# Patient Record
Sex: Male | Born: 1995 | Race: White | Hispanic: No | Marital: Single | Smoking: Light tobacco smoker
Health system: Southern US, Community
[De-identification: ages and names within clinical notes are randomized; demographics above are authoritative.]

## PROBLEM LIST (undated history)

## (undated) DIAGNOSIS — K449 Diaphragmatic hernia without obstruction or gangrene: Secondary | ICD-10-CM

## (undated) NOTE — *Deleted (*Deleted)
  [facility]-EMERGENCY DEPT Provider Note   CSN: [id number] Arrival date & time: 07/04/20  2208     History No chief complaint on file.   Garrett Baxter is a 50 y.o. male.  HPI     History reviewed. No pertinent past medical history.  There are no problems to display for this patient.   History reviewed. No pertinent surgical history.     History reviewed. No pertinent family history.  Social History   Tobacco Use  . Smoking status: Never Smoker  . Smokeless tobacco: Never Used  Substance Use Topics  . Alcohol use: Never  . Drug use: Never    Home Medications Prior to Admission medications   Not on File    Allergies    Patient has no allergy information on record.  Review of Systems   Review of Systems  Physical Exam Updated Vital Signs BP (!) 158/105 (BP Location: Left Arm)   Pulse 98   Temp 98.4 F (36.9 C) (Oral)   Resp 19   Ht 5\' 7"  (1.702 m)   Wt 111.1 kg   SpO2 99%   BMI 38.37 kg/m   Physical Exam  ED Results / Procedures / Treatments   Labs (all labs ordered are listed, but only abnormal results are displayed) Labs Reviewed - No data to display  EKG None  Radiology No results found.  Procedures Procedures (including critical care time)  Medications Ordered in ED Medications - No data to display  ED Course  I have reviewed the triage vital signs and the nursing notes.  Pertinent labs & imaging results that were available during my care of the patient were reviewed by me and considered in my medical decision making (see chart for details).    MDM Rules/Calculators/A&P                          *** Final Clinical Impression(s) / ED Diagnoses Final diagnoses:  None    Rx / DC Orders ED Discharge Orders    None

---

## 2020-07-04 ENCOUNTER — Encounter (HOSPITAL_COMMUNITY): Payer: Self-pay

## 2020-07-04 ENCOUNTER — Emergency Department (HOSPITAL_COMMUNITY)
Admission: EM | Admit: 2020-07-04 | Discharge: 2020-07-05 | Disposition: A | Discharge status: Home / Self Care | Payer: BC Managed Care – PPO | Attending: Emergency Medicine | Admitting: Emergency Medicine

## 2020-07-04 ENCOUNTER — Other Ambulatory Visit: Payer: Self-pay

## 2020-07-04 DIAGNOSIS — T7802XA Anaphylactic reaction due to shellfish (crustaceans), initial encounter: Secondary | ICD-10-CM | POA: Insufficient documentation

## 2020-07-04 DIAGNOSIS — T7840XA Allergy, unspecified, initial encounter: Secondary | ICD-10-CM

## 2020-07-04 DIAGNOSIS — L500 Allergic urticaria: Secondary | ICD-10-CM | POA: Diagnosis not present

## 2020-07-04 MED ORDER — METHYLPREDNISOLONE SODIUM SUCC 125 MG IJ SOLR
125.0000 mg | Freq: Once | INTRAMUSCULAR | Status: AC
  Administered 2020-07-04: 125 mg via INTRAVENOUS
  Filled 2020-07-04: qty 2

## 2020-07-04 MED ORDER — FAMOTIDINE IN NACL 20-0.9 MG/50ML-% IV SOLN
20.0000 mg | Freq: Once | INTRAVENOUS | Status: AC
  Administered 2020-07-04: 20 mg via INTRAVENOUS
  Filled 2020-07-04: qty 50

## 2020-07-04 NOTE — ED Provider Notes (Signed)
[facility]-EMERGENCY DEPT Provider Note   CSN: [id number] Arrival date & time: 07/04/20  2208     History No chief complaint on file.   Garrett Baxter is a 24 y.o. male who presents to ED for possible allergic reaction.  States that he was eating shrimp when he started developing hives throughout his body.  States that he has eaten shrimp in the past without any issues.  He reports itching throughout his entire torso.  Denies any lip or tongue swelling, trouble breathing or trouble swallowing.  He took 50 mg of Benadryl with some improvement in his itching.  Denies any new soaps, lotions, detergents or other food exposures.  He has not had this type of reaction in the past to anything.  HPI     History reviewed. No pertinent past medical history.  There are no problems to display for this patient.   History reviewed. No pertinent surgical history.     History reviewed. No pertinent family history.  Social History   Tobacco Use  . Smoking status: Never Smoker  . Smokeless tobacco: Never Used  Substance Use Topics  . Alcohol use: Never  . Drug use: Never    Home Medications Prior to Admission medications   Medication Sig Start Date End Date Taking? Authorizing Provider  predniSONE (STERAPRED UNI-PAK 21 TAB) 10 MG (21) TBPK tablet Take by mouth daily. Take 6 tabs by mouth daily  for 2 days, then 5 tabs for 2 days, then 4 tabs for 2 days, then 3 tabs for 2 days, 2 tabs for 2 days, then 1 tab by mouth daily for 2 days 07/05/20   Dietrich Pates, PA-C    Allergies    Patient has no allergy information on record.  Review of Systems   Review of Systems  HENT: Negative for facial swelling and trouble swallowing.   Respiratory: Negative for shortness of breath.   Skin: Positive for rash.  Neurological: Negative for weakness.    Physical Exam Updated Vital Signs BP 117/73   Pulse 94   Temp 98.4 F (36.9 C) (Oral)   Resp 16   Ht 5\' 7"  (1.702 m)    Wt 111.1 kg   SpO2 99%   BMI 38.37 kg/m   Physical Exam Vitals and nursing note reviewed.  Constitutional:      General: He is not in acute distress.    Appearance: He is well-developed. He is not diaphoretic.     Comments: No stridor.  No lip or tongue swelling.  No signs of angioedema or anaphylaxis.  Speaking complete sentences without difficulty.  HENT:     Head: Normocephalic and atraumatic.  Eyes:     General: No scleral icterus.    Conjunctiva/sclera: Conjunctivae normal.  Cardiovascular:     Rate and Rhythm: Normal rate and regular rhythm.  Pulmonary:     Effort: Pulmonary effort is normal. No respiratory distress.     Breath sounds: Normal breath sounds. No stridor.  Musculoskeletal:     Cervical back: Normal range of motion.  Skin:    Findings: Rash present.     Comments: Some scattered urticaria noted on torso.  Neurological:     Mental Status: He is alert.     ED Results / Procedures / Treatments   Labs (all labs ordered are listed, but only abnormal results are displayed) Labs Reviewed - No data to display  EKG None  Radiology No results found.  Procedures Procedures (including critical care time)  Medications Ordered in ED Medications  methylPREDNISolone sodium succinate (SOLU-MEDROL) 125 mg/2 mL injection 125 mg (125 mg Intravenous Given 07/04/20 2254)  famotidine (PEPCID) IVPB 20 mg premix (0 mg Intravenous Stopped 07/04/20 2325)    ED Course  I have reviewed the triage vital signs and the nursing notes.  Pertinent labs & imaging results that were available during my care of the patient were reviewed by me and considered in my medical decision making (see chart for details).    MDM Rules/Calculators/A&P                          24 year old male who presents to ED for possible allergic reaction. He ate shrimp just prior to arrival, which he has eaten several times in the past without difficulty.  He started developing hives throughout his  torso.  Denies any lip or tongue swelling, trouble breathing or stridor.  He took 50 mg of Benadryl with some improvement in his rash.  He has some hives scattered in torso. No signs of angioedema or anaphylaxis.  Vital signs are within normal limits.  He was given Solu-Medrol and Pepcid with resolution of his symptoms.  He remains hemodynamically stable.  Unsure what his specific trigger was although I told him to avoid shellfish until he is formally allergy tested.  Will discharge with remainder of steroid course and antihistamines.  Return precautions given.  Patient is hemodynamically stable, in NAD, and able to ambulate in the ED. Evaluation does not show pathology that would require ongoing emergent intervention or inpatient treatment. I explained the diagnosis to the patient. Pain has been managed and has no complaints prior to discharge. Patient is comfortable with above plan and is stable for discharge at this time. All questions were answered prior to disposition. Strict return precautions for returning to the ED were discussed. Encouraged follow up with PCP.   An After Visit Summary was printed and given to the patient.   Portions of this note were generated with Scientist, clinical (histocompatibility and immunogenetics). Dictation errors may occur despite best attempts at proofreading.  Final Clinical Impression(s) / ED Diagnoses Final diagnoses:  Allergic reaction, initial encounter    Rx / DC Orders ED Discharge Orders         Ordered    predniSONE (STERAPRED UNI-PAK 21 TAB) 10 MG (21) TBPK tablet  Daily        07/05/20 0003           Dietrich Pates, PA-C 07/05/20 0018    Derwood Kaplan, MD 07/05/20 [phone]

## 2020-07-04 NOTE — ED Triage Notes (Signed)
Pt states that he may be having an allergic reaction to shellfish, no known allergy before He states he ate shrimp fajitas and started to turn red and develop hives

## 2020-07-05 MED ORDER — PREDNISONE 10 MG (21) PO TBPK: ORAL_TABLET | Freq: Every day | ORAL | 0 refills | Status: DC

## 2020-07-05 NOTE — Discharge Instructions (Addendum)
Take the steroids beginning tomorrow. You can also take antihistamine such as Benadryl or Pepcid to help with your symptoms. Return to the ER if you start to experience worsening hives, rash, lip or tongue swelling, trouble breathing or trouble swallowing. You may eventually need formal allergy testing to see what the trigger was that caused this reaction today. In the meantime, you should avoid shellfish as this may have caused a reaction today.

## 2020-07-24 ENCOUNTER — Other Ambulatory Visit: Payer: Self-pay

## 2020-07-24 ENCOUNTER — Emergency Department (HOSPITAL_COMMUNITY): Payer: BC Managed Care – PPO

## 2020-07-24 ENCOUNTER — Encounter (HOSPITAL_COMMUNITY): Payer: Self-pay | Admitting: Student

## 2020-07-24 ENCOUNTER — Emergency Department (HOSPITAL_COMMUNITY)
Admission: EM | Admit: 2020-07-24 | Discharge: 2020-07-24 | Disposition: A | Discharge status: Home / Self Care | Payer: BC Managed Care – PPO | Attending: Emergency Medicine | Admitting: Emergency Medicine

## 2020-07-24 DIAGNOSIS — R1013 Epigastric pain: Secondary | ICD-10-CM | POA: Diagnosis present

## 2020-07-24 DIAGNOSIS — F17210 Nicotine dependence, cigarettes, uncomplicated: Secondary | ICD-10-CM | POA: Diagnosis not present

## 2020-07-24 DIAGNOSIS — R1110 Vomiting, unspecified: Secondary | ICD-10-CM | POA: Insufficient documentation

## 2020-07-24 DIAGNOSIS — R109 Unspecified abdominal pain: Secondary | ICD-10-CM

## 2020-07-24 DIAGNOSIS — K8020 Calculus of gallbladder without cholecystitis without obstruction: Secondary | ICD-10-CM | POA: Diagnosis not present

## 2020-07-24 HISTORY — DX: Diaphragmatic hernia without obstruction or gangrene: K44.9

## 2020-07-24 LAB — COMPREHENSIVE METABOLIC PANEL
ALT: 54 U/L — ABNORMAL HIGH (ref 0–44)
AST: 27 U/L (ref 15–41)
Albumin: 4.7 g/dL (ref 3.5–5.0)
Alkaline Phosphatase: 57 U/L (ref 38–126)
Anion gap: 11 (ref 5–15)
BUN: 13 mg/dL (ref 6–20)
CO2: 31 mmol/L (ref 22–32)
Calcium: 9.6 mg/dL (ref 8.9–10.3)
Chloride: 100 mmol/L (ref 98–111)
Creatinine, Ser: 0.79 mg/dL (ref 0.61–1.24)
GFR, Estimated: >60 mL/min (ref >60)
Glucose, Bld: 130 mg/dL — ABNORMAL HIGH (ref 70–99)
Potassium: 4.1 mmol/L (ref 3.5–5.1)
Sodium: 142 mmol/L (ref 135–145)
Total Bilirubin: 0.5 mg/dL (ref 0.3–1.2)
Total Protein: 7.7 g/dL (ref 6.5–8.1)

## 2020-07-24 LAB — CBC WITH DIFFERENTIAL/PLATELET
Abs Immature Granulocytes: 0.04 10*3/uL (ref 0.00–0.07)
Basophils Absolute: 0.1 10*3/uL (ref 0.0–0.1)
Basophils Relative: 0 %
Eosinophils Absolute: 0 10*3/uL (ref 0.0–0.5)
Eosinophils Relative: 0 %
HCT: 43 % (ref 39.0–52.0)
Hemoglobin: 14.6 g/dL (ref 13.0–17.0)
Immature Granulocytes: 0 %
Lymphocytes Relative: 21 %
Lymphs Abs: 2.5 10*3/uL (ref 0.7–4.0)
MCH: 31.3 pg (ref 26.0–34.0)
MCHC: 34 g/dL (ref 30.0–36.0)
MCV: 92.1 fL (ref 80.0–100.0)
Monocytes Absolute: 0.7 10*3/uL (ref 0.1–1.0)
Monocytes Relative: 6 %
Neutro Abs: 8.9 10*3/uL — ABNORMAL HIGH (ref 1.7–7.7)
Neutrophils Relative %: 73 %
Platelets: 355 10*3/uL (ref 150–400)
RBC: 4.67 MIL/uL (ref 4.22–5.81)
RDW: 12.3 % (ref 11.5–15.5)
WBC: 12.2 10*3/uL — ABNORMAL HIGH (ref 4.0–10.5)
nRBC: 0 % (ref 0.0–0.2)

## 2020-07-24 LAB — URINALYSIS, ROUTINE W REFLEX MICROSCOPIC
Bilirubin Urine: NEGATIVE
Glucose, UA: NEGATIVE mg/dL
Hgb urine dipstick: NEGATIVE
Ketones, ur: 5 mg/dL — AB
Leukocytes,Ua: NEGATIVE
Nitrite: NEGATIVE
Protein, ur: NEGATIVE mg/dL
Specific Gravity, Urine: 1.028 (ref 1.005–1.030)
pH: 5 (ref 5.0–8.0)

## 2020-07-24 LAB — LIPASE, BLOOD: Lipase: 26 U/L (ref 11–51)

## 2020-07-24 MED ORDER — HYDROCODONE-ACETAMINOPHEN 5-325 MG PO TABS
2.0000 | ORAL_TABLET | ORAL | 0 refills | Status: DC | PRN
Start: 2020-07-24 — End: 2020-07-27

## 2020-07-24 MED ORDER — FAMOTIDINE IN NACL 20-0.9 MG/50ML-% IV SOLN
20.0000 mg | Freq: Once | INTRAVENOUS | Status: AC
  Administered 2020-07-24: 20 mg via INTRAVENOUS
  Filled 2020-07-24: qty 50

## 2020-07-24 MED ORDER — ONDANSETRON HCL 4 MG/2ML IJ SOLN
4.0000 mg | Freq: Once | INTRAMUSCULAR | Status: AC
  Administered 2020-07-24: 4 mg via INTRAVENOUS
  Filled 2020-07-24: qty 2

## 2020-07-24 MED ORDER — MORPHINE SULFATE (PF) 4 MG/ML IV SOLN
4.0000 mg | Freq: Once | INTRAVENOUS | Status: AC
  Administered 2020-07-24: 4 mg via INTRAVENOUS
  Filled 2020-07-24: qty 1

## 2020-07-24 MED ORDER — ONDANSETRON 4 MG PO TBDP: 4.0000 mg | ORAL_TABLET | Freq: Three times a day (TID) | ORAL | 0 refills | Status: DC | PRN

## 2020-07-24 MED ORDER — SODIUM CHLORIDE 0.9 % IV BOLUS
1000.0000 mL | Freq: Once | INTRAVENOUS | Status: AC
  Administered 2020-07-24: 1000 mL via INTRAVENOUS

## 2020-07-24 NOTE — ED Notes (Addendum)
Cup of ice water and crackers given for PO challenge per [place], [place].

## 2020-07-24 NOTE — Discharge Instructions (Addendum)
Eat smaller meals, limit fatty/greasy meals as this can worsen the pain related to gallstones.  You can take tylenol as needed for mild-moderate pain. You can take hydrocodone as needed for more severe pain. You can take the zofran every 8 hours as needed for nausea. Establish care with the general surgeon to discuss further management of your gallstones. Return to the ED if you experience uncontrollable pain, uncontrollable vomiting, or  fever with abdominal pain.

## 2020-07-24 NOTE — ED Provider Notes (Signed)
Care assumed at shift change from [facility], PA-C, pending labs, RUQ U/S and re-evaluation. See their note for full HPI and workup. Briefly, pt presenting with epigastric/RUQ pain that began last night. Presentation consistent with bilary colic. Labs and RUQ ordered, pain and antiemetic medication given.  Plan to follow imaging and laboratory work.  Reevaluate.  If cholelithiasis without cholecystitis is present, plan for pain management and p.o. challenge.  Physical Exam  BP (!) 138/96   Pulse 87   Temp 98 F (36.7 C) (Oral)   Resp 18   Ht 5\' 7"  (1.702 m)   Wt 108.9 kg   SpO2 97%   BMI 37.59 kg/m   Physical Exam Vitals and nursing note reviewed.  Constitutional:      General: He is not in acute distress.    Appearance: He is well-developed.  HENT:     Head: Normocephalic and atraumatic.  Eyes:     Conjunctiva/sclera: Conjunctivae normal.  Cardiovascular:     Rate and Rhythm: Normal rate.  Pulmonary:     Effort: Pulmonary effort is normal.  Abdominal:     Palpations: Abdomen is soft.  Skin:    General: Skin is warm.  Neurological:     Mental Status: He is alert.  Psychiatric:        Behavior: Behavior normal.     Results for orders placed or performed during the hospital encounter of 07/24/20  Comprehensive metabolic panel  Result Value Ref Range   Sodium 142 135 - 145 mmol/L   Potassium 4.1 3.5 - 5.1 mmol/L   Chloride 100 98 - 111 mmol/L   CO2 31 22 - 32 mmol/L   Glucose, Bld 130 (H) 70 - 99 mg/dL   BUN 13 6 - 20 mg/dL   Creatinine, Ser 07/26/20 0.61 - 1.24 mg/dL   Calcium 9.6 8.9 - 5.39 mg/dL   Total Protein 7.7 6.5 - 8.1 g/dL   Albumin 4.7 3.5 - 5.0 g/dL   AST 27 15 - 41 U/L   ALT 54 (H) 0 - 44 U/L   Alkaline Phosphatase 57 38 - 126 U/L   Total Bilirubin 0.5 0.3 - 1.2 mg/dL   GFR, Estimated 67.2 >89 mL/min   Anion gap 11 5 - 15  CBC with Differential  Result Value Ref Range   WBC 12.2 (H) 4.0 - 10.5 K/uL   RBC 4.67 4.22 - 5.81 MIL/uL   Hemoglobin 14.6 13.0  - 17.0 g/dL   HCT >79 39 - 52 %   MCV 92.1 80.0 - 100.0 fL   MCH 31.3 26.0 - 34.0 pg   MCHC 34.0 30.0 - 36.0 g/dL   RDW 15.0 41.3 - 64.3 %   Platelets 355 150 - 400 K/uL   nRBC 0.0 0.0 - 0.2 %   Neutrophils Relative % 73 %   Neutro Abs 8.9 (H) 1.7 - 7.7 K/uL   Lymphocytes Relative 21 %   Lymphs Abs 2.5 0.7 - 4.0 K/uL   Monocytes Relative 6 %   Monocytes Absolute 0.7 0.1 - 1.0 K/uL   Eosinophils Relative 0 %   Eosinophils Absolute 0.0 0.0 - 0.5 K/uL   Basophils Relative 0 %   Basophils Absolute 0.1 0.0 - 0.1 K/uL   Immature Granulocytes 0 %   Abs Immature Granulocytes 0.04 0.00 - 0.07 K/uL  Lipase, blood  Result Value Ref Range   Lipase 26 11 - 51 U/L  Urinalysis, Routine w reflex microscopic  Result Value Ref Range   Color,  Urine YELLOW YELLOW   APPearance CLEAR CLEAR   Specific Gravity, Urine 1.028 1.005 - 1.030   pH 5.0 5.0 - 8.0   Glucose, UA NEGATIVE NEGATIVE mg/dL   Hgb urine dipstick NEGATIVE NEGATIVE   Bilirubin Urine NEGATIVE NEGATIVE   Ketones, ur 5 (A) NEGATIVE mg/dL   Protein, ur NEGATIVE NEGATIVE mg/dL   Nitrite NEGATIVE NEGATIVE   Leukocytes,Ua NEGATIVE NEGATIVE   US Abdomen Limited RUQ (LIVER/GB)  Result Date: 07/24/2020 CLINICAL DATA:  Right upper quadrant abdominal pain EXAM: ULTRASOUND ABDOMEN LIMITED RIGHT UPPER QUADRANT COMPARISON:  None. FINDINGS: Gallbladder: Multiple mobile gallstones are seen within the gallbladder lumen. The gallbladder, however, is not distended, there is no gallbladder wall thickening, and no pericholecystic fluid is identified. The sonographic Eulah Pont sign is reportedly negative. Common bile duct: Diameter: 4 mm in proximal diameter Liver: Hepatic parenchymal echogenicity is mildly, heterogeneously increased in keeping with changes of mild to moderate hepatic steatosis. No focal intrahepatic mass is identified. There is no intrahepatic biliary ductal dilation. Portal vein is patent on color Doppler imaging with normal direction of  blood flow towards the liver. Other: None. IMPRESSION: Cholelithiasis without sonographic evidence of acute cholecystitis. Mild to moderate hepatic steatosis. Electronically Signed   By: Helyn Numbers MD   On: 07/24/2020 06:52     ED Course/Procedures     Procedures  MDM  Ultrasound is consistent with uncomplicated cholelithiasis.  Labs with very minimal elevation in ALT to 54.  Slight leukocytosis of 12.2.  However, unlikely cholecystitis given significant improvement in symptoms, no findings suggestive of cholecystitis on ultrasound.  Patient is feeling much better on evaluation and tolerating p.o.  Discussed diagnosis and diet modifications as well as additional symptom management.  He is provided with referral to neurosurgery.  He states he is also followed by gastroenterology in [place].  Discussed strict return precautions including uncontrollable pain or vomiting, fever.  Patient verbalized understanding and agrees with care plan for discharge.  [facility] Controlled Substance reporting System queried       Eulonda Andalon, [place], PA-C 07/24/20 8299    Paula Libra, MD 07/24/20 2233

## 2020-07-24 NOTE — ED Triage Notes (Signed)
Pt came in for sharp epigastric pain that started approx three hours ago. Pt has had two episodes of vomiting and constant nausea. Pt has hx of hiatal hernia.

## 2020-07-24 NOTE — ED Provider Notes (Signed)
[facility]   CSN: [id number] Arrival date & time: 07/24/20  0522     History Chief Complaint  Patient presents with  . Abdominal Pain  . Vomiting    Garrett Baxter is a 24 y.o. male with a hx of hiatal hernia who presents to the ED with complaints of abdominal pain that woke him from sleep approximately 3 hours PTA. Patient's pain is in the epigastrium/RUQ with some radiation to the back at times, constant, sharp in nature, is currently a 7/10 in severity with no significant alleviating/aggravating factors. Associated nausea w/ 2 episodes of emesis as well as chills. Hx of similar pain in the past, never this severe/persistent. Denies fever, hematemesis, diarrhea, melena, hematochezia, dysuria, urgency, frequency, or hematuria. Ate [employer] last night for dinner.   HPI     Past Medical History:  Diagnosis Date  . Hiatal hernia     There are no problems to display for this patient.   History reviewed. No pertinent surgical history.     History reviewed. No pertinent family history.  Social History   Tobacco Use  . Smoking status: Light Tobacco Smoker    Types: Cigarettes  . Smokeless tobacco: Never Used  Substance Use Topics  . Alcohol use: Yes  . Drug use: Never    Home Medications Prior to Admission medications   Medication Sig Start Date End Date Taking? Authorizing Provider  predniSONE (STERAPRED UNI-PAK 21 TAB) 10 MG (21) TBPK tablet Take by mouth daily. Take 6 tabs by mouth daily  for 2 days, then 5 tabs for 2 days, then 4 tabs for 2 days, then 3 tabs for 2 days, 2 tabs for 2 days, then 1 tab by mouth daily for 2 days 07/05/20   Dietrich Pates, PA-C    Allergies    Shellfish allergy  Review of Systems   Review of Systems  Constitutional: Negative for chills and fever.  Respiratory: Negative for shortness of breath.   Cardiovascular: Negative for chest pain.  Gastrointestinal: Positive for abdominal  pain, nausea and vomiting. Negative for anal bleeding, blood in stool, constipation and diarrhea.  Genitourinary: Negative for dysuria, hematuria and urgency.  Neurological: Negative for syncope.  All other systems reviewed and are negative.   Physical Exam Updated Vital Signs BP (!) 147/100   Pulse 70   Temp 98 F (36.7 C) (Oral)   Resp 13   Ht 5\' 7"  (1.702 m)   Wt 108.9 kg   SpO2 97%   BMI 37.59 kg/m   Physical Exam Vitals and nursing note reviewed.  Constitutional:      General: He is not in acute distress.    Appearance: He is well-developed. He is not toxic-appearing.  HENT:     Head: Normocephalic and atraumatic.  Eyes:     General:        Right eye: No discharge.        Left eye: No discharge.     Conjunctiva/sclera: Conjunctivae normal.  Cardiovascular:     Rate and Rhythm: Normal rate and regular rhythm.  Pulmonary:     Effort: Pulmonary effort is normal. No respiratory distress.     Breath sounds: Normal breath sounds. No wheezing, rhonchi or rales.  Abdominal:     General: There is no distension.     Palpations: Abdomen is soft.     Tenderness: There is abdominal tenderness in the right upper quadrant and epigastric area. There is no guarding or rebound. Negative  signs include McBurney's sign.  Musculoskeletal:     Cervical back: Neck supple.  Skin:    General: Skin is warm and dry.     Findings: No rash.  Neurological:     Mental Status: He is alert.     Comments: Clear speech.   Psychiatric:        Behavior: Behavior normal.     ED Results / Procedures / Treatments   Labs (all labs ordered are listed, but only abnormal results are displayed) Labs Reviewed  COMPREHENSIVE METABOLIC PANEL  CBC WITH DIFFERENTIAL/PLATELET  LIPASE, BLOOD    EKG None  Radiology No results found.  Procedures Procedures (including critical care time)  Medications Ordered in ED Medications  sodium chloride 0.9 % bolus 1,000 mL (has no administration in time  range)  ondansetron (ZOFRAN) injection 4 mg (has no administration in time range)  famotidine (PEPCID) IVPB 20 mg premix (has no administration in time range)  morphine 4 MG/ML injection 4 mg (has no administration in time range)    ED Course  I have reviewed the triage vital signs and the nursing notes.  Pertinent labs & imaging results that were available during my care of the patient were reviewed by me and considered in my medical decision making (see chart for details).    MDM Rules/Calculators/A&P                          Patient presents to the ED with complaints of abdominal pain.  Nontoxic, vitals without significant abnormality.  On exam epigastric & RUQ tenderness to palpation.   DDX: Cholelithiasis, cholecystitis, cholangitis, choledocholithiasis, pancreatitis, GERD, PUD, pyelonephritis, nephrolithiasis, appendicitis.  Additional history obtained:  Additional history obtained from chart review & nursing note review.   Morphine ordered for pain, zofran for nausea, & fluids for hydration. Plan for labs & RUQ [place].   Lab Tests:  I ordered, reviewed, & interpreted labs including:  CBC: Mild leukocytosis @ 12.2 with left shift.  CMP, lipase, UA: Pending  Imaging Studies ordered:  I ordered imaging studies which included RUQ [place]- pending at this time  Patient care signed out to [place] PA-C at change of shift pending [place], remaining laboratory testing, & disposition.   Portions of this note were generated with Scientist, clinical (histocompatibility and immunogenetics). Dictation errors may occur despite best attempts at proofreading.  Final Clinical Impression(s) / ED Diagnoses Final diagnoses:  Abdominal pain    Rx / DC Orders ED Discharge Orders    None       Cherly Anderson, PA-C 07/24/20 0636    Molpus, Jonny Ruiz, MD 07/24/20 [phone]

## 2020-07-25 ENCOUNTER — Observation Stay (HOSPITAL_COMMUNITY)
Admission: EM | Admit: 2020-07-25 | Discharge: 2020-07-27 | Disposition: A | Discharge status: Home / Self Care | Payer: BC Managed Care – PPO | Attending: Surgery | Admitting: Surgery

## 2020-07-25 ENCOUNTER — Ambulatory Visit: Payer: Self-pay

## 2020-07-25 ENCOUNTER — Emergency Department (HOSPITAL_COMMUNITY): Payer: BC Managed Care – PPO

## 2020-07-25 ENCOUNTER — Encounter (HOSPITAL_COMMUNITY): Payer: Self-pay

## 2020-07-25 ENCOUNTER — Other Ambulatory Visit: Payer: Self-pay

## 2020-07-25 DIAGNOSIS — F17210 Nicotine dependence, cigarettes, uncomplicated: Secondary | ICD-10-CM | POA: Diagnosis not present

## 2020-07-25 DIAGNOSIS — Z419 Encounter for procedure for purposes other than remedying health state, unspecified: Secondary | ICD-10-CM

## 2020-07-25 DIAGNOSIS — K819 Cholecystitis, unspecified: Secondary | ICD-10-CM | POA: Diagnosis not present

## 2020-07-25 DIAGNOSIS — K810 Acute cholecystitis: Secondary | ICD-10-CM

## 2020-07-25 DIAGNOSIS — R109 Unspecified abdominal pain: Secondary | ICD-10-CM | POA: Diagnosis present

## 2020-07-25 DIAGNOSIS — Z20822 Contact with and (suspected) exposure to covid-19: Secondary | ICD-10-CM | POA: Diagnosis not present

## 2020-07-25 DIAGNOSIS — K8000 Calculus of gallbladder with acute cholecystitis without obstruction: Secondary | ICD-10-CM | POA: Diagnosis present

## 2020-07-25 DIAGNOSIS — D72829 Elevated white blood cell count, unspecified: Secondary | ICD-10-CM

## 2020-07-25 DIAGNOSIS — D72819 Decreased white blood cell count, unspecified: Secondary | ICD-10-CM | POA: Insufficient documentation

## 2020-07-25 LAB — CBC WITH DIFFERENTIAL/PLATELET
Abs Immature Granulocytes: 0.05 10*3/uL (ref 0.00–0.07)
Basophils Absolute: 0.1 10*3/uL (ref 0.0–0.1)
Basophils Relative: 0 %
Eosinophils Absolute: 0.1 10*3/uL (ref 0.0–0.5)
Eosinophils Relative: 1 %
HCT: 43.6 % (ref 39.0–52.0)
Hemoglobin: 14.4 g/dL (ref 13.0–17.0)
Immature Granulocytes: 0 %
Lymphocytes Relative: 15 %
Lymphs Abs: 2.6 10*3/uL (ref 0.7–4.0)
MCH: 30.7 pg (ref 26.0–34.0)
MCHC: 33 g/dL (ref 30.0–36.0)
MCV: 93 fL (ref 80.0–100.0)
Monocytes Absolute: 2.1 10*3/uL — ABNORMAL HIGH (ref 0.1–1.0)
Monocytes Relative: 12 %
Neutro Abs: 12.5 10*3/uL — ABNORMAL HIGH (ref 1.7–7.7)
Neutrophils Relative %: 72 %
Platelets: 363 10*3/uL (ref 150–400)
RBC: 4.69 MIL/uL (ref 4.22–5.81)
RDW: 12.3 % (ref 11.5–15.5)
WBC: 17.4 10*3/uL — ABNORMAL HIGH (ref 4.0–10.5)
nRBC: 0 % (ref 0.0–0.2)

## 2020-07-25 LAB — COMPREHENSIVE METABOLIC PANEL
ALT: 40 U/L (ref 0–44)
AST: 17 U/L (ref 15–41)
Albumin: 4.3 g/dL (ref 3.5–5.0)
Alkaline Phosphatase: 57 U/L (ref 38–126)
Anion gap: 11 (ref 5–15)
BUN: 11 mg/dL (ref 6–20)
CO2: 27 mmol/L (ref 22–32)
Calcium: 9 mg/dL (ref 8.9–10.3)
Chloride: 100 mmol/L (ref 98–111)
Creatinine, Ser: 0.76 mg/dL (ref 0.61–1.24)
GFR, Estimated: >60 mL/min (ref >60)
Glucose, Bld: 108 mg/dL — ABNORMAL HIGH (ref 70–99)
Potassium: 3.7 mmol/L (ref 3.5–5.1)
Sodium: 138 mmol/L (ref 135–145)
Total Bilirubin: 0.9 mg/dL (ref 0.3–1.2)
Total Protein: 7.6 g/dL (ref 6.5–8.1)

## 2020-07-25 LAB — RESP PANEL BY RT-PCR (FLU A&B, COVID) ARPGX2
Influenza A by PCR: NEGATIVE
Influenza B by PCR: NEGATIVE
SARS Coronavirus 2 by RT PCR: NEGATIVE

## 2020-07-25 LAB — LIPASE, BLOOD: Lipase: 23 U/L (ref 11–51)

## 2020-07-25 MED ORDER — HYDROMORPHONE HCL 1 MG/ML IJ SOLN
1.0000 mg | Freq: Once | INTRAMUSCULAR | Status: AC
  Administered 2020-07-25: 1 mg via INTRAVENOUS
  Filled 2020-07-25: qty 1

## 2020-07-25 MED ORDER — SODIUM CHLORIDE 0.9 % IV SOLN
2.0000 g | Freq: Once | INTRAVENOUS | Status: AC
  Administered 2020-07-25: 2 g via INTRAVENOUS
  Filled 2020-07-25: qty 20

## 2020-07-25 MED ORDER — MORPHINE SULFATE (PF) 4 MG/ML IV SOLN
4.0000 mg | Freq: Once | INTRAVENOUS | Status: AC
  Administered 2020-07-25: 4 mg via INTRAVENOUS
  Filled 2020-07-25: qty 1

## 2020-07-25 MED ORDER — ONDANSETRON HCL 4 MG/2ML IJ SOLN
4.0000 mg | Freq: Once | INTRAMUSCULAR | Status: AC
  Administered 2020-07-25: 4 mg via INTRAVENOUS
  Filled 2020-07-25: qty 2

## 2020-07-25 MED ORDER — IOHEXOL 300 MG/ML  SOLN
100.0000 mL | Freq: Once | INTRAMUSCULAR | Status: AC | PRN
  Administered 2020-07-25: 100 mL via INTRAVENOUS

## 2020-07-25 NOTE — ED Triage Notes (Addendum)
Patient arrived stating that on Monday he was diagnosed with a hernia and gallstones, which flared up tonight. Complaints of RUQ pain and nausea. Reports a fever of 101.0 at 4, took tylenol.

## 2020-07-25 NOTE — H&P (Signed)
Called by ED.  Persistent RUQ and biliary colic - equivocal radiologic signs of acute cholecystitis.  Will admit patient and start antibiotics.  Will evaluate for cholecystectomy in AM.  No significant PMH.  Wilmon Arms. Corliss Skains, MD, [facility] Surgery  General/ Trauma Surgery   07/25/2020 11:50 PM

## 2020-07-25 NOTE — Telephone Encounter (Signed)
Patient called and says he went to the ED yesterday for abdominal pain and was told he possibly has gallstones. He says he was discharged home and was told to call if he has a fever. He says this morning it was 101.7. I advised per his discharge instructions to come back to the ED for uncontrolled pain, vomiting, fever with abdominal pain. He says he only had the fever this morning without abdominal pain. He says the pain is controlled with medication. I advised to follow up with PCP and general surgery as noted on discharge instructions. He says he has a PCP in [place], [place] where he's from. I advised to call tomorrow and make them aware of the ED visit. Patient verbalized understanding.  Reason for Disposition  Health Information question, no triage required and triager able to answer question  Answer Assessment - Initial Assessment Questions 1. REASON FOR CALL or QUESTION: "What is your reason for calling today?" or "How can I best help you?" or "What question do you have that I can help answer?"     I have questions about fever  Protocols used: INFORMATION ONLY CALL - NO TRIAGE-A-AH

## 2020-07-25 NOTE — ED Provider Notes (Signed)
[facility]-EMERGENCY DEPT Provider Note   CSN: [id number] Arrival date & time: 07/25/20  1958     History Chief Complaint  Patient presents with  . Cholelithiasis    Garrett Baxter is a 24 y.o. male.  Presents to ER with concern for abdominal pain, fever.  Symptoms ongoing for the last few days, worse on right side, worse in right upper abdomen.  Intermittent, sharp pain, seems to come and go at random.  More constant today.  Had a fever up to 102.  Went to urgent care, they sent Covid test.  Has not gotten results yet.  Mild nausea but no vomiting, no diarrhea.  Per chart review recent ER visit diagnosed with cholelithiasis but no cholecystitis.  HPI     Past Medical History:  Diagnosis Date  . Hiatal hernia     There are no problems to display for this patient.   History reviewed. No pertinent surgical history.     No family history on file.  Social History   Tobacco Use  . Smoking status: Light Tobacco Smoker    Types: Cigarettes  . Smokeless tobacco: Never Used  Substance Use Topics  . Alcohol use: Yes  . Drug use: Never    Home Medications Prior to Admission medications   Medication Sig Start Date End Date Taking? Authorizing Provider  acetaminophen (TYLENOL) 500 MG tablet Take 1,000 mg by mouth every 6 (six) hours as needed for moderate pain.   Yes [provider]  Dexlansoprazole (DEXILANT) 30 MG capsule Take 30 mg by mouth daily as needed (acid reflux).   Yes [provider]  ibuprofen (ADVIL) 200 MG tablet Take 400 mg by mouth every 6 (six) hours as needed for fever, headache or mild pain.   Yes [provider]  ondansetron (ZOFRAN ODT) 4 MG disintegrating tablet Take 1 tablet (4 mg total) by mouth every 8 (eight) hours as needed for nausea or vomiting. 07/24/20  Yes Robinson, [place] N, PA-C  HYDROcodone-acetaminophen (NORCO/VICODIN) 5-325 MG tablet Take 2 tablets by mouth every 4 (four) hours as  needed. Patient taking differently: Take 2 tablets by mouth every 4 (four) hours as needed for moderate pain.  07/24/20   Robinson, [place] N, PA-C    Allergies    Shellfish allergy  Review of Systems   Review of Systems  Constitutional: Positive for fever. Negative for chills.  HENT: Negative for ear pain and sore throat.   Eyes: Negative for pain and visual disturbance.  Respiratory: Negative for cough and shortness of breath.   Cardiovascular: Negative for chest pain and palpitations.  Gastrointestinal: Positive for abdominal pain and nausea. Negative for vomiting.  Genitourinary: Negative for dysuria and hematuria.  Musculoskeletal: Negative for arthralgias and back pain.  Skin: Negative for color change and rash.  Neurological: Negative for seizures and syncope.  All other systems reviewed and are negative.   Physical Exam Updated Vital Signs BP (!) 156/103   Pulse 78   Temp 99 F (37.2 C) (Oral)   Resp 19   SpO2 97%   Physical Exam Vitals and nursing note reviewed.  Constitutional:      Appearance: He is well-developed.  HENT:     Head: Normocephalic and atraumatic.  Eyes:     Conjunctiva/sclera: Conjunctivae normal.  Cardiovascular:     Rate and Rhythm: Normal rate and regular rhythm.     Heart sounds: No murmur heard.   Pulmonary:     Effort: Pulmonary effort is normal.  No respiratory distress.     Breath sounds: Normal breath sounds.  Abdominal:     Palpations: Abdomen is soft.     Tenderness: There is abdominal tenderness.     Comments: Tenderness to palpation right lower quadrant, right upper quadrant  Musculoskeletal:        General: No deformity or signs of injury.     Cervical back: Neck supple.  Skin:    General: Skin is warm and dry.     Capillary Refill: Capillary refill takes less than 2 seconds.  Neurological:     General: No focal deficit present.     Mental Status: He is alert.  Psychiatric:        Mood and Affect: Mood normal.         Behavior: Behavior normal.     ED Results / Procedures / Treatments   Labs (all labs ordered are listed, but only abnormal results are displayed) Labs Reviewed  CBC WITH DIFFERENTIAL/PLATELET - Abnormal; Notable for the following components:      Result Value   WBC 17.4 (*)    Neutro Abs 12.5 (*)    Monocytes Absolute 2.1 (*)    All other components within normal limits  COMPREHENSIVE METABOLIC PANEL - Abnormal; Notable for the following components:   Glucose, Bld 108 (*)    All other components within normal limits  RESP PANEL BY RT-PCR (FLU A&B, COVID) ARPGX2  LIPASE, BLOOD  URINALYSIS, ROUTINE W REFLEX MICROSCOPIC    EKG None  Radiology CT ABDOMEN PELVIS W CONTRAST  Result Date: 07/25/2020 CLINICAL DATA:  Right lower quadrant abdominal pain, nausea, fever EXAM: CT ABDOMEN AND PELVIS WITH CONTRAST TECHNIQUE: Multidetector CT imaging of the abdomen and pelvis was performed using the standard protocol following bolus administration of intravenous contrast. CONTRAST:  OMNIPAQUE IOHEXOL 300 MG/ML  SOLN COMPARISON:  07/24/2020, 07/25/2020 FINDINGS: Lower chest: No acute pleural or parenchymal lung disease. Hepatobiliary: Gallbladder is moderately distended. Gallstone seen on previous ultrasound are not calcified and therefore not apparent on CT. There is mild gallbladder wall thickening and pericholecystic fat stranding, which could reflect acute cholecystitis. The liver is grossly unremarkable. Pancreas: Unremarkable. No pancreatic ductal dilatation or surrounding inflammatory changes. Spleen: Normal in size without focal abnormality. Adrenals/Urinary Tract: Adrenal glands are unremarkable. Kidneys are normal, without renal calculi, focal lesion, or hydronephrosis. Bladder is unremarkable. Stomach/Bowel: No bowel obstruction or ileus. Normal appendix right lower quadrant. No wall thickening or inflammatory change. Vascular/Lymphatic: No significant vascular findings are present. No  enlarged abdominal or pelvic lymph nodes. Reproductive: Prostate is unremarkable. Other: No free fluid or free gas.  No abdominal wall hernia. Musculoskeletal: No acute or destructive bony lesions. 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 IMPRESSION: 1. Distended gallbladder, with mild gallbladder wall thickening and pericholecystic fluid. Findings are equivocal for acute cholecystitis. If further evaluation is desired, nuclear medicine hepatobiliary scan could be considered. Electronically Signed   By: Sharlet Salina M.D.   On: 07/25/2020 22:27   US Abdomen Limited  Result Date: 07/25/2020 CLINICAL DATA:  Right upper quadrant pain for 2 days, fever, evaluate for cholecystitis EXAM: ULTRASOUND ABDOMEN LIMITED RIGHT UPPER QUADRANT COMPARISON:  Ultrasound 07/24/2020 FINDINGS: Gallbladder: Gallbladder contains multiple echogenic, shadowing gallstones measuring up to 1.7 cm in diameter. Gallbladder wall thickness is within normal limits. No pericholecystic fluid. Sonographic Eulah Pont sign is reportedly negative. Common bile duct: Diameter: 3.9 mm, nondilated Liver: Diffusely increased hepatic echogenicity with loss of definition of the portal triads and diminished  posterior through transmission compatible with hepatic steatosis. No focal liver lesion. No intrahepatic biliary ductal dilatation. Portal vein is patent on color Doppler imaging with normal direction of blood flow towards the liver. Other: None. IMPRESSION: 1. Cholelithiasis without convincing sonographic evidence of acute cholecystitis though given continued symptoms could consider further evaluation with HIDA scan as clinically warranted. 2. Hepatic steatosis. Electronically Signed   By: Kreg ShropshirePrice  DeHay M.D.   On: 07/25/2020 21:34   US Abdomen Limited RUQ (LIVER/GB)  Result Date: 07/24/2020 CLINICAL DATA:  Right upper quadrant abdominal pain EXAM: ULTRASOUND ABDOMEN LIMITED RIGHT UPPER QUADRANT COMPARISON:   None. FINDINGS: Gallbladder: Multiple mobile gallstones are seen within the gallbladder lumen. The gallbladder, however, is not distended, there is no gallbladder wall thickening, and no pericholecystic fluid is identified. The sonographic Eulah PontMurphy sign is reportedly negative. Common bile duct: Diameter: 4 mm in proximal diameter Liver: Hepatic parenchymal echogenicity is mildly, heterogeneously increased in keeping with changes of mild to moderate hepatic steatosis. No focal intrahepatic mass is identified. There is no intrahepatic biliary ductal dilation. Portal vein is patent on color Doppler imaging with normal direction of blood flow towards the liver. Other: None. IMPRESSION: Cholelithiasis without sonographic evidence of acute cholecystitis. Mild to moderate hepatic steatosis. Electronically Signed   By: Helyn NumbersAshesh  Parikh MD   On: 07/24/2020 06:52    Procedures Procedures (including critical care time)  Medications Ordered in ED Medications  cefTRIAXone (ROCEPHIN) 2 g in sodium chloride 0.9 % 100 mL IVPB (2 g Intravenous New Bag/Given 07/25/20 2321)  morphine 4 MG/ML injection 4 mg (4 mg Intravenous Given 07/25/20 2108)  ondansetron (ZOFRAN) injection 4 mg (4 mg Intravenous Given 07/25/20 2108)  iohexol (OMNIPAQUE) 300 MG/ML solution 100 mL (100 mLs Intravenous Contrast Given 07/25/20 2213)  HYDROmorphone (DILAUDID) injection 1 mg (1 mg Intravenous Given 07/25/20 2303)    ED Course  I have reviewed the triage vital signs and the nursing notes.  Pertinent labs & imaging results that were available during my care of the patient were reviewed by me and considered in my medical decision making (see chart for details).  Clinical Course as of Jul 25 2345  Tue Jul 25, 2020  2144 Ultrasound equivocal, will check CT scan to evaluate for other possible causes of his fever and abdominal pain, rule out appendicitis   [RD]    Clinical Course User Index [RD] Milagros Lollykstra, Maleia Weems S, MD   MDM  Rules/Calculators/A&P                         24 year old male presents to the emergency room with concern for abdominal pain, fever.  On exam here noted focal tenderness in his right upper quadrant but otherwise soft abdomen.  Lab work concerning for worsening leukocytosis, LFTs within normal limits.  Ultrasound without convincing sonographic evidence of acute cholecystitis and radiologist recommended considering HIDA scan.  CT scan was ordered to further evaluate as well as rule out other causes of his abdominal pain and fever.  CT demonstrating distended gallbladder, mild wall thickening and pericholecystic fluid, equivocal for acute cholecystitis.    I reviewed findings as well as my exam and lab work with on-call general surgery, Dr. Corliss Skainssuei due to my concern for cholecystitis.  He recommended antibiotics, n.p.o., admission to his service.  Likely will proceed with cholecystectomy tomorrow morning for presumed acute chole.  Final Clinical Impression(s) / ED Diagnoses Final diagnoses:  Acute cholecystitis  Leukocytosis, unspecified type    Rx /  DC Orders ED Discharge Orders    None       Milagros Loll, MD 07/25/20 [phone]

## 2020-07-26 ENCOUNTER — Observation Stay (HOSPITAL_COMMUNITY): Payer: BC Managed Care – PPO | Admitting: Certified Registered Nurse Anesthetist

## 2020-07-26 ENCOUNTER — Observation Stay (HOSPITAL_COMMUNITY): Payer: BC Managed Care – PPO

## 2020-07-26 ENCOUNTER — Encounter (HOSPITAL_COMMUNITY): Payer: Self-pay

## 2020-07-26 ENCOUNTER — Encounter (HOSPITAL_COMMUNITY)
Admission: EM | Disposition: A | Discharge status: Home / Self Care | Payer: Self-pay | Source: Home / Self Care | Attending: Emergency Medicine

## 2020-07-26 HISTORY — PX: CHOLECYSTECTOMY: SHX55

## 2020-07-26 LAB — HIV ANTIBODY (ROUTINE TESTING W REFLEX): HIV Screen 4th Generation wRfx: NONREACTIVE

## 2020-07-26 LAB — URINALYSIS, ROUTINE W REFLEX MICROSCOPIC
Bilirubin Urine: NEGATIVE
Glucose, UA: NEGATIVE mg/dL
Hgb urine dipstick: NEGATIVE
Ketones, ur: 20 mg/dL — AB
Leukocytes,Ua: NEGATIVE
Nitrite: NEGATIVE
Protein, ur: NEGATIVE mg/dL
Specific Gravity, Urine: >1.046 — ABNORMAL HIGH (ref 1.005–1.030)
pH: 6 (ref 5.0–8.0)

## 2020-07-26 SURGERY — LAPAROSCOPIC CHOLECYSTECTOMY WITH INTRAOPERATIVE CHOLANGIOGRAM
Anesthesia: General | Site: Abdomen

## 2020-07-26 MED ORDER — OXYCODONE HCL 5 MG/5ML PO SOLN: 5.0000 mg | Freq: Once | ORAL | Status: DC | PRN

## 2020-07-26 MED ORDER — BUPIVACAINE LIPOSOME 1.3 % IJ SUSP
20.0000 mL | Freq: Once | INTRAMUSCULAR | Status: AC
  Administered 2020-07-26: 20 mL
  Filled 2020-07-26: qty 20

## 2020-07-26 MED ORDER — LACTATED RINGERS IR SOLN
Status: DC | PRN
  Administered 2020-07-26: 1000 mL

## 2020-07-26 MED ORDER — FENTANYL CITRATE (PF) 100 MCG/2ML IJ SOLN: 25.0000 ug | INTRAMUSCULAR | Status: DC | PRN

## 2020-07-26 MED ORDER — DEXAMETHASONE SODIUM PHOSPHATE 10 MG/ML IJ SOLN
INTRAMUSCULAR | Status: AC
  Filled 2020-07-26: qty 1

## 2020-07-26 MED ORDER — SCOPOLAMINE 1 MG/3DAYS TD PT72
1.0000 | MEDICATED_PATCH | TRANSDERMAL | Status: DC
  Administered 2020-07-26: 1.5 mg via TRANSDERMAL
  Filled 2020-07-26: qty 1

## 2020-07-26 MED ORDER — HYDROMORPHONE HCL 1 MG/ML IJ SOLN
1.0000 mg | INTRAMUSCULAR | Status: DC | PRN
  Administered 2020-07-26 (×8): 1 mg via INTRAVENOUS
  Filled 2020-07-26 (×9): qty 1

## 2020-07-26 MED ORDER — SODIUM CHLORIDE 0.9 % IV SOLN
2.0000 g | Freq: Once | INTRAVENOUS | Status: AC
  Administered 2020-07-26: 2 g via INTRAVENOUS
  Filled 2020-07-26: qty 2

## 2020-07-26 MED ORDER — 0.9 % SODIUM CHLORIDE (POUR BTL) OPTIME
TOPICAL | Status: DC | PRN
  Administered 2020-07-26: 1000 mL

## 2020-07-26 MED ORDER — ROCURONIUM BROMIDE 10 MG/ML (PF) SYRINGE
PREFILLED_SYRINGE | INTRAVENOUS | Status: DC | PRN
  Administered 2020-07-26: 50 mg via INTRAVENOUS
  Administered 2020-07-26: 10 mg via INTRAVENOUS

## 2020-07-26 MED ORDER — LIDOCAINE 2% (20 MG/ML) 5 ML SYRINGE
INTRAMUSCULAR | Status: DC | PRN
  Administered 2020-07-26: 100 mg via INTRAVENOUS

## 2020-07-26 MED ORDER — PROPOFOL 10 MG/ML IV BOLUS
INTRAVENOUS | Status: AC
  Filled 2020-07-26: qty 20

## 2020-07-26 MED ORDER — KETAMINE HCL 10 MG/ML IJ SOLN
INTRAMUSCULAR | Status: DC | PRN
  Administered 2020-07-26: 50 mg via INTRAVENOUS

## 2020-07-26 MED ORDER — SUGAMMADEX SODIUM 500 MG/5ML IV SOLN
INTRAVENOUS | Status: AC
  Filled 2020-07-26: qty 5

## 2020-07-26 MED ORDER — ENOXAPARIN SODIUM 40 MG/0.4ML ~~LOC~~ SOLN
40.0000 mg | SUBCUTANEOUS | Status: DC
  Administered 2020-07-27: 40 mg via SUBCUTANEOUS
  Filled 2020-07-26: qty 0.4

## 2020-07-26 MED ORDER — LACTATED RINGERS IV SOLN: INTRAVENOUS | Status: DC

## 2020-07-26 MED ORDER — POTASSIUM CHLORIDE IN NACL 20-0.9 MEQ/L-% IV SOLN
INTRAVENOUS | Status: DC
  Filled 2020-07-26 (×4): qty 1000

## 2020-07-26 MED ORDER — PANTOPRAZOLE SODIUM 40 MG PO TBEC
40.0000 mg | DELAYED_RELEASE_TABLET | Freq: Every day | ORAL | Status: DC
  Administered 2020-07-26 – 2020-07-27 (×2): 40 mg via ORAL
  Filled 2020-07-26 (×2): qty 1

## 2020-07-26 MED ORDER — IOHEXOL 300 MG/ML  SOLN
INTRAMUSCULAR | Status: DC | PRN
  Administered 2020-07-26: 5 mL

## 2020-07-26 MED ORDER — KETOROLAC TROMETHAMINE 15 MG/ML IJ SOLN
15.0000 mg | INTRAMUSCULAR | Status: AC
  Administered 2020-07-26: 15 mg via INTRAVENOUS
  Filled 2020-07-26: qty 1

## 2020-07-26 MED ORDER — PROPOFOL 10 MG/ML IV BOLUS
INTRAVENOUS | Status: DC | PRN
  Administered 2020-07-26: 200 mg via INTRAVENOUS

## 2020-07-26 MED ORDER — ACETAMINOPHEN 500 MG PO TABS: 1000.0000 mg | ORAL_TABLET | ORAL | Status: DC

## 2020-07-26 MED ORDER — FENTANYL CITRATE (PF) 100 MCG/2ML IJ SOLN
INTRAMUSCULAR | Status: DC | PRN
  Administered 2020-07-26 (×4): 50 ug via INTRAVENOUS

## 2020-07-26 MED ORDER — PHENYLEPHRINE 40 MCG/ML (10ML) SYRINGE FOR IV PUSH (FOR BLOOD PRESSURE SUPPORT)
PREFILLED_SYRINGE | INTRAVENOUS | Status: DC | PRN
  Administered 2020-07-26 (×2): 120 ug via INTRAVENOUS
  Administered 2020-07-26: 40 ug via INTRAVENOUS
  Administered 2020-07-26: 80 ug via INTRAVENOUS
  Administered 2020-07-26: 120 ug via INTRAVENOUS

## 2020-07-26 MED ORDER — DEXAMETHASONE SODIUM PHOSPHATE 10 MG/ML IJ SOLN
INTRAMUSCULAR | Status: DC | PRN
  Administered 2020-07-26: 8 mg via INTRAVENOUS

## 2020-07-26 MED ORDER — ONDANSETRON HCL 4 MG/2ML IJ SOLN
4.0000 mg | Freq: Four times a day (QID) | INTRAMUSCULAR | Status: DC | PRN
  Administered 2020-07-26: 10:00:00 4 mg via INTRAVENOUS
  Filled 2020-07-26 (×3): qty 2

## 2020-07-26 MED ORDER — ONDANSETRON HCL 4 MG/2ML IJ SOLN
INTRAMUSCULAR | Status: DC | PRN
  Administered 2020-07-26: 4 mg via INTRAVENOUS

## 2020-07-26 MED ORDER — OXYCODONE HCL 5 MG PO TABS: 5.0000 mg | ORAL_TABLET | Freq: Once | ORAL | Status: DC | PRN

## 2020-07-26 MED ORDER — ACETAMINOPHEN 325 MG PO TABS
650.0000 mg | ORAL_TABLET | Freq: Four times a day (QID) | ORAL | Status: DC | PRN
  Administered 2020-07-26 – 2020-07-27 (×2): 650 mg via ORAL
  Filled 2020-07-26 (×2): qty 2

## 2020-07-26 MED ORDER — ACETAMINOPHEN 650 MG RE SUPP: 650.0000 mg | Freq: Four times a day (QID) | RECTAL | Status: DC | PRN

## 2020-07-26 MED ORDER — GABAPENTIN 300 MG PO CAPS
300.0000 mg | ORAL_CAPSULE | ORAL | Status: AC
  Administered 2020-07-26: 300 mg via ORAL
  Filled 2020-07-26: qty 1

## 2020-07-26 MED ORDER — ONDANSETRON HCL 4 MG/2ML IJ SOLN
INTRAMUSCULAR | Status: AC
  Filled 2020-07-26: qty 2

## 2020-07-26 MED ORDER — PHENYLEPHRINE 40 MCG/ML (10ML) SYRINGE FOR IV PUSH (FOR BLOOD PRESSURE SUPPORT)
PREFILLED_SYRINGE | INTRAVENOUS | Status: AC
  Filled 2020-07-26: qty 20

## 2020-07-26 MED ORDER — MIDAZOLAM HCL 5 MG/5ML IJ SOLN
INTRAMUSCULAR | Status: DC | PRN
  Administered 2020-07-26: 2 mg via INTRAVENOUS

## 2020-07-26 MED ORDER — KETAMINE HCL 10 MG/ML IJ SOLN
INTRAMUSCULAR | Status: AC
  Filled 2020-07-26: qty 1

## 2020-07-26 MED ORDER — PROMETHAZINE HCL 25 MG/ML IJ SOLN: 6.2500 mg | INTRAMUSCULAR | Status: DC | PRN

## 2020-07-26 MED ORDER — FENTANYL CITRATE (PF) 250 MCG/5ML IJ SOLN
INTRAMUSCULAR | Status: AC
  Filled 2020-07-26: qty 5

## 2020-07-26 MED ORDER — SUGAMMADEX SODIUM 500 MG/5ML IV SOLN
INTRAVENOUS | Status: DC | PRN
  Administered 2020-07-26: 217.8 mg via INTRAVENOUS

## 2020-07-26 MED ORDER — OXYCODONE HCL 5 MG PO TABS
5.0000 mg | ORAL_TABLET | ORAL | Status: DC | PRN
  Administered 2020-07-26 – 2020-07-27 (×5): 10 mg via ORAL
  Filled 2020-07-26 (×5): qty 2

## 2020-07-26 MED ORDER — ONDANSETRON 4 MG PO TBDP: 4.0000 mg | ORAL_TABLET | Freq: Four times a day (QID) | ORAL | Status: DC | PRN

## 2020-07-26 SURGICAL SUPPLY — 42 items
APPLICATOR COTTON TIP 6 STRL (MISCELLANEOUS) ×2 IMPLANT
APPLICATOR COTTON TIP 6IN STRL (MISCELLANEOUS) ×6
APPLIER CLIP 5 13 M/L LIGAMAX5 (MISCELLANEOUS)
APPLIER CLIP ROT 10 11.4 M/L (STAPLE) ×6
BENZOIN TINCTURE PRP APPL 2/3 (GAUZE/BANDAGES/DRESSINGS) IMPLANT
CABLE HIGH FREQUENCY MONO STRZ (ELECTRODE) IMPLANT
CATH REDDICK CHOLANGI 4FR 50CM (CATHETERS) ×3 IMPLANT
CLIP APPLIE 5 13 M/L LIGAMAX5 (MISCELLANEOUS) IMPLANT
CLIP APPLIE ROT 10 11.4 M/L (STAPLE) ×2 IMPLANT
CLOSURE WOUND 1/2 X4 (GAUZE/BANDAGES/DRESSINGS)
COVER MAYO STAND STRL (DRAPES) ×3 IMPLANT
COVER SURGICAL LIGHT HANDLE (MISCELLANEOUS) ×3 IMPLANT
COVER WAND RF STERILE (DRAPES) IMPLANT
DECANTER SPIKE VIAL GLASS SM (MISCELLANEOUS) ×3 IMPLANT
DERMABOND ADVANCED (GAUZE/BANDAGES/DRESSINGS) ×2
DERMABOND ADVANCED .7 DNX12 (GAUZE/BANDAGES/DRESSINGS) ×1 IMPLANT
DRAPE C-ARM 42X120 X-RAY (DRAPES) ×3 IMPLANT
ELECT L-HOOK LAP 45CM DISP (ELECTROSURGICAL) ×3
ELECT PENCIL ROCKER SW 15FT (MISCELLANEOUS) ×3 IMPLANT
ELECT REM PT RETURN 15FT ADLT (MISCELLANEOUS) ×3 IMPLANT
ELECTRODE L-HOOK LAP 45CM DISP (ELECTROSURGICAL) ×1 IMPLANT
GLOVE BIOGEL M 8.0 STRL (GLOVE) ×3 IMPLANT
GOWN STRL REUS W/TWL XL LVL3 (GOWN DISPOSABLE) ×3 IMPLANT
HEMOSTAT SURGICEL 2X14 (HEMOSTASIS) ×3 IMPLANT
HEMOSTAT SURGICEL 4X8 (HEMOSTASIS) IMPLANT
IV CATH 14GX2 1/4 (CATHETERS) ×3 IMPLANT
KIT BASIN OR (CUSTOM PROCEDURE TRAY) ×3 IMPLANT
KIT TURNOVER KIT A (KITS) ×3 IMPLANT
POUCH RETRIEVAL ECOSAC 10 (ENDOMECHANICALS) ×1 IMPLANT
POUCH RETRIEVAL ECOSAC 10MM (ENDOMECHANICALS) ×2
SCISSORS LAP 5X45 EPIX DISP (ENDOMECHANICALS) ×3 IMPLANT
SET IRRIG TUBING LAPAROSCOPIC (IRRIGATION / IRRIGATOR) ×3 IMPLANT
SET TUBE SMOKE EVAC HIGH FLOW (TUBING) ×3 IMPLANT
SLEEVE XCEL OPT CAN 5 100 (ENDOMECHANICALS) ×6 IMPLANT
STRIP CLOSURE SKIN 1/2X4 (GAUZE/BANDAGES/DRESSINGS) IMPLANT
SUT MNCRL AB 4-0 PS2 18 (SUTURE) ×6 IMPLANT
SYR 20ML LL LF (SYRINGE) ×3 IMPLANT
TOWEL OR 17X26 10 PK STRL BLUE (TOWEL DISPOSABLE) ×3 IMPLANT
TRAY LAPAROSCOPIC (CUSTOM PROCEDURE TRAY) ×3 IMPLANT
TROCAR BLADELESS OPT 5 100 (ENDOMECHANICALS) ×3 IMPLANT
TROCAR XCEL BLUNT TIP 100MML (ENDOMECHANICALS) IMPLANT
TROCAR XCEL NON-BLD 11X100MML (ENDOMECHANICALS) ×3 IMPLANT

## 2020-07-26 NOTE — Anesthesia Postprocedure Evaluation (Signed)
Anesthesia Post Note  Patient: Allen Egerton  Procedure(s) Performed: LAPAROSCOPIC CHOLECYSTECTOMY WITH INTRAOPERATIVE CHOLANGIOGRAM (N/A Abdomen)     Patient location during evaluation: PACU Anesthesia Type: General Level of consciousness: awake and alert Pain management: pain level controlled Vital Signs Assessment: post-procedure vital signs reviewed and stable Respiratory status: spontaneous breathing, nonlabored ventilation and respiratory function stable Cardiovascular status: blood pressure returned to baseline and stable Postop Assessment: no apparent nausea or vomiting Anesthetic complications: no   No complications documented.  Last Vitals:  Vitals:   07/26/20 1515 07/26/20 1530  BP: (!) 143/88 (!) 152/89  Pulse: (!) 103 (!) 105  Resp: 20 (!) 22  Temp:  37.4 C  SpO2: 95% 95%    Last Pain:  Vitals:   07/26/20 1530  TempSrc:   PainSc: 0-No pain                 Beryle Lathe

## 2020-07-26 NOTE — Interval H&P Note (Signed)
History and Physical Interval Note:  07/26/2020 12:12 PM  Garrett Baxter  has presented today for surgery, with the diagnosis of CHOLELITHIASIS.  The various methods of treatment have been discussed with the patient and family. After consideration of risks, benefits and other options for treatment, the patient has consented to  Procedure(s): LAPAROSCOPIC CHOLECYSTECTOMY WITH INTRAOPERATIVE CHOLANGIOGRAM (N/A) as a surgical intervention.  The patient's history has been reviewed, patient examined, no change in status, stable for surgery.  I have reviewed the patient's chart and labs.  Questions were answered to the patient's satisfaction.     Valarie Merino

## 2020-07-26 NOTE — Transfer of Care (Signed)
Immediate Anesthesia Transfer of Care Note  Patient: Garrett Baxter  Procedure(s) Performed: LAPAROSCOPIC CHOLECYSTECTOMY WITH INTRAOPERATIVE CHOLANGIOGRAM (N/A Abdomen)  Patient Location: PACU  Anesthesia Type:General  Level of Consciousness: drowsy and patient cooperative  Airway & Oxygen Therapy: Patient Spontanous Breathing and Patient connected to face mask oxygen  Post-op Assessment: Report given to RN and Post -op Vital signs reviewed and stable  Post vital signs: Reviewed and stable  Last Vitals:  Vitals Value Taken Time  BP    Temp    Pulse    Resp    SpO2      Last Pain:  Vitals:   07/26/20 1104  TempSrc:   PainSc: 5       Patients Stated Pain Goal: 4 (07/26/20 1104)  Complications: No complications documented.

## 2020-07-26 NOTE — Anesthesia Procedure Notes (Signed)
Procedure Name: Intubation Date/Time: 07/26/2020 12:27 PM Performed by: Montel Clock, CRNA Pre-anesthesia Checklist: Patient identified, Emergency Drugs available, Suction available, Patient being monitored and Timeout performed Patient Re-evaluated:Patient Re-evaluated prior to induction Oxygen Delivery Method: Circle system utilized Preoxygenation: Pre-oxygenation with 100% oxygen Induction Type: IV induction Ventilation: Oral airway inserted - appropriate to patient size Laryngoscope Size: Mac and 3 Grade View: Grade II Tube type: Oral Tube size: 7.5 mm Number of attempts: 1 Airway Equipment and Method: Stylet Placement Confirmation: ETT inserted through vocal cords under direct vision,  positive ETCO2 and breath sounds checked- equal and bilateral Secured at: 22 cm Tube secured with: Tape Dental Injury: Teeth and Oropharynx as per pre-operative assessment  Comments: MAC 3, downward laryngeal pressure, grade 2, ETT easily passed. MAC 4 may be better size (deep)

## 2020-07-26 NOTE — Progress Notes (Signed)
Unable to get vitals for yellow  MEWS protocol due to pt being down for surgery.

## 2020-07-26 NOTE — Discharge Instructions (Signed)
[facility] LAPAROSCOPIC SURGERY: POST OP INSTRUCTIONS Always review your discharge instruction sheet given to you by the facility where your surgery was performed. IF YOU HAVE DISABILITY OR FAMILY LEAVE FORMS, YOU MUST BRING THEM TO THE OFFICE FOR PROCESSING.   DO NOT GIVE THEM TO YOUR DOCTOR.  PAIN CONTROL  1. First take acetaminophen (Tylenol) AND/or ibuprofen (Advil) to control your pain after surgery.  Follow directions on package.  Taking acetaminophen (Tylenol) and/or ibuprofen (Advil) regularly after surgery will help to control your pain and lower the amount of prescription pain medication you may need.  You should not take more than 3,000 mg (3 grams) of acetaminophen (Tylenol) in 24 hours.  You should not take ibuprofen (Advil), aleve, motrin, naprosyn or other NSAIDS if you have a history of stomach ulcers or chronic kidney disease.  2. A prescription for pain medication may be given to you upon discharge.  Take your pain medication as prescribed, if you still have uncontrolled pain after taking acetaminophen (Tylenol) or ibuprofen (Advil). 3. Use ice packs to help control pain. 4. If you need a refill on your pain medication, please contact your pharmacy.  They will contact our office to request authorization. Prescriptions will not be filled after 5pm or on week-ends.  HOME MEDICATIONS 5. Take your usually prescribed medications unless otherwise directed.  DIET 6. You should follow a light diet the first few days after arrival home.  Be sure to include lots of fluids daily. Avoid fatty, fried foods.   CONSTIPATION 7. It is common to experience some constipation after surgery and if you are taking pain medication.  Increasing fluid intake and taking a stool softener (such as Colace) will usually help or prevent this problem from occurring.  A mild laxative (Milk of Magnesia or Miralax) should be taken according to package instructions if there are no bowel  movements after 48 hours.  WOUND/INCISION CARE 8. Most patients will experience some swelling and bruising in the area of the incisions.  Ice packs will help.  Swelling and bruising can take several days to resolve.  9. Unless discharge instructions indicate otherwise, follow guidelines below  a. STERI-STRIPS - you may remove your outer bandages 48 hours after surgery, and you may shower at that time.  You have steri-strips (small skin tapes) in place directly over the incision.  These strips should be left on the skin for 7-10 days.   b. DERMABOND/SKIN GLUE - you may shower in 24 hours.  The glue will flake off over the next 2-3 weeks. 10. Any sutures or staples will be removed at the office during your follow-up visit.  ACTIVITIES 11. You may resume regular (light) daily activities beginning the next day--such as daily self-care, walking, climbing stairs--gradually increasing activities as tolerated.  You may have sexual intercourse when it is comfortable.  Refrain from any heavy lifting or straining until approved by your doctor. a. You may drive when you are no longer taking prescription pain medication, you can comfortably wear a seatbelt, and you can safely maneuver your car and apply brakes.  FOLLOW-UP 12. You should see your doctor in the office for a follow-up appointment approximately 2-3 weeks after your surgery.  You should have been given your post-op/follow-up appointment when your surgery was scheduled.  If you did not receive a post-op/follow-up appointment, make sure that you call for this appointment within a day or two after you arrive home to insure a convenient appointment time.     WHEN TO CALL YOUR DOCTOR: 1. Fever over 101.0 2. Inability to urinate 3. Continued bleeding from incision. 4. Increased pain, redness, or drainage from the incision. 5. Increasing abdominal pain  The clinic staff is available to answer your questions during regular business hours.  Please don't  hesitate to call and ask to speak to one of the nurses for clinical concerns.  If you have a medical emergency, go to the nearest emergency room or call 911.  A surgeon from [facility] is always on call at the hospital. [address], [address], [place], [place]  [address] ? [address] [address], [place],    [address] [phone] ? [phone] ? FAX [phone] Web site: [email or web address]  .........   Managing Your Pain After Surgery Without Opioids    Thank you for participating in our program to help patients manage their pain after surgery without opioids. This is part of our effort to provide you with the best care possible, without exposing you or your family to the risk that opioids pose.  What pain can I expect after surgery? You can expect to have some pain after surgery. This is normal. The pain is typically worse the day after surgery, and quickly begins to get better. Many studies have found that many patients are able to manage their pain after surgery with Over-the-Counter (OTC) medications such as Tylenol and Motrin. If you have a condition that does not allow you to take Tylenol or Motrin, notify your surgical team.  How will I manage my pain? The best strategy for controlling your pain after surgery is around the clock pain control with Tylenol (acetaminophen) and Motrin (ibuprofen or Advil). Alternating these medications with each other allows you to maximize your pain control. In addition to Tylenol and Motrin, you can use heating pads or ice packs on your incisions to help reduce your pain.  How will I alternate your regular strength over-the-counter pain medication? You will take a dose of pain medication every three hours. ; Start by taking 650 mg of Tylenol (2 pills of 325 mg) ; 3 hours later take 600 mg of Motrin (3 pills of 200 mg) ; 3 hours after taking the Motrin take 650 mg of Tylenol ; 3 hours after that take 600 mg of  Motrin.   - 1 -  See example - if your first dose of Tylenol is at 12:00 PM   12:00 PM Tylenol 650 mg (2 pills of 325 mg)  3:00 PM Motrin 600 mg (3 pills of 200 mg)  6:00 PM Tylenol 650 mg (2 pills of 325 mg)  9:00 PM Motrin 600 mg (3 pills of 200 mg)  Continue alternating every 3 hours   We recommend that you follow this schedule around-the-clock for at least 3 days after surgery, or until you feel that it is no longer needed. Use the table on the last page of this handout to keep track of the medications you are taking. Important: Do not take more than 3000mg of Tylenol or 3200mg of Motrin in a 24-hour period. Do not take ibuprofen/Motrin if you have a history of bleeding stomach ulcers, severe kidney disease, &/or actively taking a blood thinner  What if I still have pain? If you have pain that is not controlled with the over-the-counter pain medications (Tylenol and Motrin or Advil) you might have what we call "breakthrough" pain. You will receive a prescription for a small amount of an opioid pain medication such as   Oxycodone, Tramadol, or Tylenol with Codeine. Use these opioid pills in the first 24 hours after surgery if you have breakthrough pain. Do not take more than 1 pill every 4-6 hours.  If you still have uncontrolled pain after using all opioid pills, don't hesitate to call our staff using the number provided. We will help make sure you are managing your pain in the best way possible, and if necessary, we can provide a prescription for additional pain medication.   Day 1    Time  Name of Medication Number of pills taken  Amount of Acetaminophen  Pain Level   Comments  AM PM       AM PM       AM PM       AM PM       AM PM       AM PM       AM PM       AM PM       Total Daily amount of Acetaminophen Do not take more than  3,000 mg per day      Day 2    Time  Name of Medication Number of pills taken  Amount of Acetaminophen  Pain Level   Comments  AM  PM       AM PM       AM PM       AM PM       AM PM       AM PM       AM PM       AM PM       Total Daily amount of Acetaminophen Do not take more than  3,000 mg per day      Day 3    Time  Name of Medication Number of pills taken  Amount of Acetaminophen  Pain Level   Comments  AM PM       AM PM       AM PM       AM PM          AM PM       AM PM       AM PM       AM PM       Total Daily amount of Acetaminophen Do not take more than  3,000 mg per day      Day 4    Time  Name of Medication Number of pills taken  Amount of Acetaminophen  Pain Level   Comments  AM PM       AM PM       AM PM       AM PM       AM PM       AM PM       AM PM       AM PM       Total Daily amount of Acetaminophen Do not take more than  3,000 mg per day      Day 5    Time  Name of Medication Number of pills taken  Amount of Acetaminophen  Pain Level   Comments  AM PM       AM PM       AM PM       AM PM       AM PM       AM PM       AM PM         AM PM       Total Daily amount of Acetaminophen Do not take more than  3,000 mg per day       Day 6    Time  Name of Medication Number of pills taken  Amount of Acetaminophen  Pain Level  Comments  AM PM       AM PM       AM PM       AM PM       AM PM       AM PM       AM PM       AM PM       Total Daily amount of Acetaminophen Do not take more than  3,000 mg per day      Day 7    Time  Name of Medication Number of pills taken  Amount of Acetaminophen  Pain Level   Comments  AM PM       AM PM       AM PM       AM PM       AM PM       AM PM       AM PM       AM PM       Total Daily amount of Acetaminophen Do not take more than  3,000 mg per day        For additional information about how and where to safely dispose of unused opioid medications - https://www.[employer]ful[employer].org  Disclaimer: This document contains information and/or instructional materials adapted from [facility]  for the typical patient with your condition. It does not replace medical advice from your health care provider because your experience may differ from that of the typical patient. Talk to your health care provider if you have any questions about this document, your condition or your treatment plan. Adapted from [facility]  

## 2020-07-26 NOTE — Anesthesia Preprocedure Evaluation (Addendum)
Anesthesia Evaluation  Patient identified by MRN, date of birth, ID band Patient awake    Reviewed: Allergy & Precautions, NPO status , Patient's Chart, lab work & pertinent test results  History of Anesthesia Complications Negative for: history of anesthetic complications  Airway Mallampati: IV  TM Distance: >3 FB Neck ROM: Full    Dental  (+) Dental Advisory Given, Teeth Intact   Pulmonary Current Smoker and Patient abstained from smoking.,    Pulmonary exam normal        Cardiovascular negative cardio ROS Normal cardiovascular exam     Neuro/Psych negative neurological ROS  negative psych ROS   GI/Hepatic Neg liver ROS, hiatal hernia,   Endo/Other   Obesity   Renal/GU negative Renal ROS     Musculoskeletal negative musculoskeletal ROS (+)   Abdominal (+) + obese,   Peds  Hematology negative hematology ROS (+)   Anesthesia Other Findings Covid test negative   Reproductive/Obstetrics                            Anesthesia Physical Anesthesia Plan  ASA: II  Anesthesia Plan: General   Post-op Pain Management:    Induction: Intravenous  PONV Risk Score and Plan: 4 or greater and Treatment may vary due to age or medical condition, Ondansetron, Midazolam and Dexamethasone  Airway Management Planned: Oral ETT  Additional Equipment: None  Intra-op Plan:   Post-operative Plan: Extubation in OR  Informed Consent: I have reviewed the patients History and Physical, chart, labs and discussed the procedure including the risks, benefits and alternatives for the proposed anesthesia with the patient or authorized representative who has indicated his/her understanding and acceptance.     Dental advisory given  Plan Discussed with: CRNA and Anesthesiologist  Anesthesia Plan Comments: (Will have glidescope available if needed)       Anesthesia Quick Evaluation

## 2020-07-26 NOTE — Progress Notes (Signed)
Patient turned to a yellow MEWS due to pulse of 121 and temperature of 100. Tylenol given and yellow MEWS protocol implemented.

## 2020-07-26 NOTE — ED Notes (Addendum)
ED TO INPATIENT HANDOFF REPORT  ED Nurse Name and Phone #: Sharene Skeans [phone]  S Name/Age/Gender Garrett Baxter 24 y.o. male Room/Bed: WA07/WA07  Code Status   Code Status: Not on file  Home/SNF/Other Rehab Patient oriented to: self, place, time and situation Is this baseline? Yes   Triage Complete: Triage complete  Chief Complaint Acute cholecystitis due to biliary calculus [K80.00]  Triage Note Patient arrived stating that on Monday he was diagnosed with a hernia and gallstones, which flared up tonight. Complaints of RUQ pain and nausea. Reports a fever of 101.0 at 4, took tylenol.     Allergies Allergies  Allergen Reactions  . Shellfish Allergy Swelling    Level of Care/Admitting Diagnosis ED Disposition    ED Disposition Condition Comment   Admit  Hospital Area: [facility] [[id number]]  Level of Care: Med-Surg [16]  Covid Evaluation: Asymptomatic Screening Protocol (No Symptoms)  Diagnosis: Acute cholecystitis due to biliary calculus [[id number]]  Admitting Physician: CCS, MD [[id number]]  Attending Physician: CCS, MD [[id number]]       B Medical/Surgery History Past Medical History:  Diagnosis Date  . Hiatal hernia    History reviewed. No pertinent surgical history.   A IV Location/Drains/Wounds Patient Lines/Drains/Airways Status    Active Line/Drains/Airways    Name Placement date Placement time Site Days   Peripheral IV 07/24/20 Left Antecubital 07/24/20  0605  Antecubital  2          Intake/Output Last 24 hours No intake or output data in the 24 hours ending 07/26/20 0040  Labs/Imaging Results for orders placed or performed during the hospital encounter of 07/25/20 (from the past 48 hour(s))  CBC with Differential     Status: Abnormal   Collection Time: 07/25/20  8:35 PM  Result Value Ref Range   WBC 17.4 (H) 4.0 - 10.5 K/uL   RBC 4.69 4.22 - 5.81 MIL/uL   Hemoglobin 14.4 13.0 - 17.0 g/dL   HCT 50.9 39 - 52 %   MCV 93.0 80.0 -  100.0 fL   MCH 30.7 26.0 - 34.0 pg   MCHC 33.0 30.0 - 36.0 g/dL   RDW 32.6 71.2 - 45.8 %   Platelets 363 150 - 400 K/uL   nRBC 0.0 0.0 - 0.2 %   Neutrophils Relative % 72 %   Neutro Abs 12.5 (H) 1.7 - 7.7 K/uL   Lymphocytes Relative 15 %   Lymphs Abs 2.6 0.7 - 4.0 K/uL   Monocytes Relative 12 %   Monocytes Absolute 2.1 (H) 0.1 - 1.0 K/uL   Eosinophils Relative 1 %   Eosinophils Absolute 0.1 0.0 - 0.5 K/uL   Basophils Relative 0 %   Basophils Absolute 0.1 0.0 - 0.1 K/uL   Immature Granulocytes 0 %   Abs Immature Granulocytes 0.05 0.00 - 0.07 K/uL    Comment: Performed at [facility], [address], [place], [place] [address]  Comprehensive metabolic panel     Status: Abnormal   Collection Time: 07/25/20  8:35 PM  Result Value Ref Range   Sodium 138 135 - 145 mmol/L   Potassium 3.7 3.5 - 5.1 mmol/L   Chloride 100 98 - 111 mmol/L   CO2 27 22 - 32 mmol/L   Glucose, Bld 108 (H) 70 - 99 mg/dL    Comment: Glucose reference range applies only to samples taken after fasting for at least 8 hours.   BUN 11 6 - 20 mg/dL   Creatinine, Ser 3.82  0.61 - 1.24 mg/dL   Calcium 9.0 8.9 - 16.1 mg/dL   Total Protein 7.6 6.5 - 8.1 g/dL   Albumin 4.3 3.5 - 5.0 g/dL   AST 17 15 - 41 U/L   ALT 40 0 - 44 U/L   Alkaline Phosphatase 57 38 - 126 U/L   Total Bilirubin 0.9 0.3 - 1.2 mg/dL   GFR, Estimated >09 >60 mL/min    Comment: (NOTE) Calculated using the CKD-EPI Creatinine Equation (2021)    Anion gap 11 5 - 15    Comment: Performed at [facility], [address] [place], [place] [address]  Lipase, blood     Status: None   Collection Time: 07/25/20  8:35 PM  Result Value Ref Range   Lipase 23 11 - 51 U/L    Comment: Performed at [facility], [address], [place], [place] [address]  Resp Panel by RT-PCR (Flu A&B, Covid) Nasopharyngeal Swab     Status: None   Collection Time: 07/25/20  8:35 PM   Specimen: Nasopharyngeal Swab;  Nasopharyngeal(NP) swabs in vial transport medium  Result Value Ref Range   SARS Coronavirus 2 by RT PCR NEGATIVE NEGATIVE    Comment: (NOTE) SARS-CoV-2 target nucleic acids are NOT DETECTED.  The SARS-CoV-2 RNA is generally detectable in upper respiratory specimens during the acute phase of infection. The lowest concentration of SARS-CoV-2 viral copies this assay can detect is 138 copies/mL. A negative result does not preclude SARS-Cov-2 infection and should not be used as the sole basis for treatment or other patient management decisions. A negative result may occur with  improper specimen collection/handling, submission of specimen other than nasopharyngeal swab, presence of viral mutation(s) within the areas targeted by this assay, and inadequate number of viral copies(<138 copies/mL). A negative result must be combined with clinical observations, patient history, and epidemiological information. The expected result is Negative.  Fact Sheet for Patients:  BloggerCourse.com  Fact Sheet for Healthcare Providers:  SeriousBroker.it  This test is no t yet approved or cleared by the [place] FDA and  has been authorized for detection and/or diagnosis of SARS-CoV-2 by FDA under an Emergency Use Authorization (EUA). This EUA will remain  in effect (meaning this test can be used) for the duration of the COVID-19 declaration under Section 564(b)(1) of the Act, 21 U.S.C.section 360bbb-3(b)(1), unless the authorization is terminated  or revoked sooner.       Influenza A by PCR NEGATIVE NEGATIVE   Influenza B by PCR NEGATIVE NEGATIVE    Comment: (NOTE) The Xpert Xpress SARS-CoV-2/FLU/RSV plus assay is intended as an aid in the diagnosis of influenza from Nasopharyngeal swab specimens and should not be used as a sole basis for treatment. Nasal washings and aspirates are unacceptable for Xpert Xpress  SARS-CoV-2/FLU/RSV testing.  Fact Sheet for Patients: BloggerCourse.com  Fact Sheet for Healthcare Providers: SeriousBroker.it  This test is not yet approved or cleared by the [place] FDA and has been authorized for detection and/or diagnosis of SARS-CoV-2 by FDA under an Emergency Use Authorization (EUA). This EUA will remain in effect (meaning this test can be used) for the duration of the COVID-19 declaration under Section 564(b)(1) of the Act, 21 U.S.C. section 360bbb-3(b)(1), unless the authorization is terminated or revoked.  Performed at [facility], [address], [place], [place] [address]   Urinalysis, Routine w reflex microscopic Urine, Clean Catch     Status: Abnormal   Collection Time: 07/25/20 11:00 PM  Result Value Ref Range   Color, Urine YELLOW YELLOW   APPearance CLEAR CLEAR   Specific Gravity, Urine >1.046 (H) 1.005 - 1.030   pH 6.0 5.0 - 8.0   Glucose, UA NEGATIVE NEGATIVE mg/dL   Hgb urine dipstick NEGATIVE NEGATIVE   Bilirubin Urine NEGATIVE NEGATIVE   Ketones, ur 20 (A) NEGATIVE mg/dL   Protein, ur NEGATIVE NEGATIVE mg/dL   Nitrite NEGATIVE NEGATIVE   Leukocytes,Ua NEGATIVE NEGATIVE    Comment: Performed at [facility], [address] [place], [place] [address]   CT ABDOMEN PELVIS W CONTRAST  Result Date: 07/25/2020 CLINICAL DATA:  Right lower quadrant abdominal pain, nausea, fever EXAM: CT ABDOMEN AND PELVIS WITH CONTRAST TECHNIQUE: Multidetector CT imaging of the abdomen and pelvis was performed using the standard protocol following bolus administration of intravenous contrast. CONTRAST:  100mL OMNIPAQUE IOHEXOL 300 MG/ML  SOLN COMPARISON:  07/24/2020, 07/25/2020 FINDINGS: Lower chest: No acute pleural or parenchymal lung disease. Hepatobiliary: Gallbladder is moderately distended. Gallstone seen on previous ultrasound are not calcified and therefore not  apparent on CT. There is mild gallbladder wall thickening and pericholecystic fat stranding, which could reflect acute cholecystitis. The liver is grossly unremarkable. Pancreas: Unremarkable. No pancreatic ductal dilatation or surrounding inflammatory changes. Spleen: Normal in size without focal abnormality. Adrenals/Urinary Tract: Adrenal glands are unremarkable. Kidneys are normal, without renal calculi, focal lesion, or hydronephrosis. Bladder is unremarkable. Stomach/Bowel: No bowel obstruction or ileus. Normal appendix right lower quadrant. No wall thickening or inflammatory change. Vascular/Lymphatic: No significant vascular findings are present. No enlarged abdominal or pelvic lymph nodes. Reproductive: Prostate is unremarkable. Other: No free fluid or free gas.  No abdominal wall hernia. Musculoskeletal: No acute or destructive bony lesions. 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 IMPRESSION: 1. Distended gallbladder, with mild gallbladder wall thickening and pericholecystic fluid. Findings are equivocal for acute cholecystitis. If further evaluation is desired, nuclear medicine hepatobiliary scan could be considered. Electronically Signed   By: Sharlet SalinaMichael  Brown M.D.   On: 07/25/2020 22:27   US Abdomen Limited  Result Date: 07/25/2020 CLINICAL DATA:  Right upper quadrant pain for 2 days, fever, evaluate for cholecystitis EXAM: ULTRASOUND ABDOMEN LIMITED RIGHT UPPER QUADRANT COMPARISON:  Ultrasound 07/24/2020 FINDINGS: Gallbladder: Gallbladder contains multiple echogenic, shadowing gallstones measuring up to 1.7 cm in diameter. Gallbladder wall thickness is within normal limits. No pericholecystic fluid. Sonographic Eulah PontMurphy sign is reportedly negative. Common bile duct: Diameter: 3.9 mm, nondilated Liver: Diffusely increased hepatic echogenicity with loss of definition of the portal triads and diminished posterior through transmission compatible with hepatic  steatosis. No focal liver lesion. No intrahepatic biliary ductal dilatation. Portal vein is patent on color Doppler imaging with normal direction of blood flow towards the liver. Other: None. IMPRESSION: 1. Cholelithiasis without convincing sonographic evidence of acute cholecystitis though given continued symptoms could consider further evaluation with HIDA scan as clinically warranted. 2. Hepatic steatosis. Electronically Signed   By: Kreg ShropshirePrice  DeHay M.D.   On: 07/25/2020 21:34   US Abdomen Limited RUQ (LIVER/GB)  Result Date: 07/24/2020 CLINICAL DATA:  Right upper quadrant abdominal pain EXAM: ULTRASOUND ABDOMEN LIMITED RIGHT UPPER QUADRANT COMPARISON:  None. FINDINGS: Gallbladder: Multiple mobile gallstones are seen within the gallbladder lumen. The gallbladder, however, is not distended, there is no gallbladder wall thickening, and no pericholecystic fluid is identified. The sonographic Eulah PontMurphy sign is reportedly negative. Common bile duct: Diameter: 4 mm in proximal diameter Liver: Hepatic parenchymal echogenicity is mildly, heterogeneously increased in keeping with changes of mild  to moderate hepatic steatosis. No focal intrahepatic mass is identified. There is no intrahepatic biliary ductal dilation. Portal vein is patent on color Doppler imaging with normal direction of blood flow towards the liver. Other: None. IMPRESSION: Cholelithiasis without sonographic evidence of acute cholecystitis. Mild to moderate hepatic steatosis. Electronically Signed   By: Helyn Numbers MD   On: 07/24/2020 06:52    Pending Labs Unresulted Labs (From admission, onward)          Start     Ordered   Signed and Held  HIV Antibody (routine testing w rflx)  (HIV Antibody (Routine testing w reflex) panel)  Once,   R        Signed and Held          Vitals/Pain Today's Vitals   07/25/20 2300 07/25/20 2330 07/25/20 2343 07/26/20 0000  BP: (!) 158/90 (!) 156/103  (!) 154/124  Pulse: 85 78  92  Resp: (!) 24 19  (!)  28  Temp:      TempSrc:      SpO2: 98% 97%  100%  PainSc:   4      Isolation Precautions No active isolations  Medications Medications  morphine 4 MG/ML injection 4 mg (4 mg Intravenous Given 07/25/20 2108)  ondansetron (ZOFRAN) injection 4 mg (4 mg Intravenous Given 07/25/20 2108)  iohexol (OMNIPAQUE) 300 MG/ML solution 100 mL (100 mLs Intravenous Contrast Given 07/25/20 2213)  HYDROmorphone (DILAUDID) injection 1 mg (1 mg Intravenous Given 07/25/20 2303)  cefTRIAXone (ROCEPHIN) 2 g in sodium chloride 0.9 % 100 mL IVPB (2 g Intravenous New Bag/Given 07/25/20 2321)    Mobility walks Low fall risk   Focused Assessments Gen Med   R Recommendations: See Admitting Provider Note  Report given to: Adrianna RN  Additional Notes:

## 2020-07-26 NOTE — H&P (Signed)
Admission Note  Garrett NaegeliMatthew Baxter 07-Feb-1996  [id number].    Requesting MD: Stevie Kernykstra Chief Complaint/Reason for Consult: cholecystitis  HPI:  Patient is a 24 year old male who [facility] and fever. Pain started in RUQ 2 days ago and described as intermittent and sharp. Pain radiates to right flank and chest. Febrile to 101 at home. Some associated nausea but no vomiting. Denies SOB, urinary symptoms, diarrhea, constipation. PMH otherwise significant for hiatal hernia. No past abdominal surgery. He takes dexilant as needed for symptoms related to hiatal hernia. Allergic to shellfish. He works as an Biomedical scientistuber driver. Reports occasional alcohol and tobacco use but not daily. Denies illicit drug use.   ROS: Review of Systems  Constitutional: Positive for fever. Negative for chills.  Respiratory: Negative for shortness of breath and wheezing.   Cardiovascular: Negative for chest pain and palpitations.  Gastrointestinal: Positive for abdominal pain and nausea. Negative for blood in stool, constipation, diarrhea, melena and vomiting.  Genitourinary: Negative for dysuria, frequency and urgency.  Skin: Negative for itching and rash.  All other systems reviewed and are negative.   No family history on file.  Past Medical History:  Diagnosis Date  . Hiatal hernia     History reviewed. No pertinent surgical history.  Social History:  reports that he has been smoking cigarettes. He has never used smokeless tobacco. He reports current alcohol use. He reports that he does not use drugs.  Allergies:  Allergies  Allergen Reactions  . Shellfish Allergy Swelling    Medications Prior to Admission  Medication Sig Dispense Refill  . acetaminophen (TYLENOL) 500 MG tablet Take 1,000 mg by mouth every 6 (six) hours as needed for moderate pain.    Marland Kitchen. Dexlansoprazole (DEXILANT) 30 MG capsule Take 30 mg by mouth daily as needed (acid reflux).    Marland Kitchen. ibuprofen (ADVIL) 200 MG  tablet Take 400 mg by mouth every 6 (six) hours as needed for fever, headache or mild pain.    Marland Kitchen. ondansetron (ZOFRAN ODT) 4 MG disintegrating tablet Take 1 tablet (4 mg total) by mouth every 8 (eight) hours as needed for nausea or vomiting. 20 tablet 0  . HYDROcodone-acetaminophen (NORCO/VICODIN) 5-325 MG tablet Take 2 tablets by mouth every 4 (four) hours as needed. (Patient taking differently: Take 2 tablets by mouth every 4 (four) hours as needed for moderate pain. ) 12 tablet 0    Blood pressure (!) 148/89, pulse (!) 107, temperature 99.6 F (37.6 C), temperature source Oral, resp. rate 19, height 5\' 7"  (1.702 m), weight 108.9 kg, SpO2 91 %. Physical Exam:  General: pleasant, WD, obese male who is laying in bed in NAD HEENT: head is normocephalic, atraumatic.  Sclera are anicteric.  PERRL.  Ears and nose without any masses or lesions.  Mouth is pink and moist Heart: regular, rate, and rhythm.  Normal s1,s2. No obvious murmurs, gallops, or rubs noted.  Palpable radial and pedal pulses bilaterally Lungs: CTAB, no wheezes, rhonchi, or rales noted.  Respiratory effort nonlabored Abd: soft, ttp in RUQ with positive Murphy sign, ND, +BS, no masses, hernias, or organomegaly MS: all 4 extremities are symmetrical with no cyanosis, clubbing, or edema. Skin: warm and dry with no masses, lesions, or rashes Neuro: Cranial nerves 2-12 grossly intact, sensation is normal throughout Psych: A&Ox3 with an appropriate affect.   Results for orders placed or performed during the hospital encounter of 07/25/20 (from the past 48 hour(s))  CBC with Differential  Status: Abnormal   Collection Time: 07/25/20  8:35 PM  Result Value Ref Range   WBC 17.4 (H) 4.0 - 10.5 K/uL   RBC 4.69 4.22 - 5.81 MIL/uL   Hemoglobin 14.4 13.0 - 17.0 g/dL   HCT 72.5 39 - 52 %   MCV 93.0 80.0 - 100.0 fL   MCH 30.7 26.0 - 34.0 pg   MCHC 33.0 30.0 - 36.0 g/dL   RDW 36.6 44.0 - 34.7 %   Platelets 363 150 - 400 K/uL   nRBC 0.0  0.0 - 0.2 %   Neutrophils Relative % 72 %   Neutro Abs 12.5 (H) 1.7 - 7.7 K/uL   Lymphocytes Relative 15 %   Lymphs Abs 2.6 0.7 - 4.0 K/uL   Monocytes Relative 12 %   Monocytes Absolute 2.1 (H) 0.1 - 1.0 K/uL   Eosinophils Relative 1 %   Eosinophils Absolute 0.1 0.0 - 0.5 K/uL   Basophils Relative 0 %   Basophils Absolute 0.1 0.0 - 0.1 K/uL   Immature Granulocytes 0 %   Abs Immature Granulocytes 0.05 0.00 - 0.07 K/uL    Comment: Performed at [facility], [address] [place], [place] [address]  Comprehensive metabolic panel     Status: Abnormal   Collection Time: 07/25/20  8:35 PM  Result Value Ref Range   Sodium 138 135 - 145 mmol/L   Potassium 3.7 3.5 - 5.1 mmol/L   Chloride 100 98 - 111 mmol/L   CO2 27 22 - 32 mmol/L   Glucose, Bld 108 (H) 70 - 99 mg/dL    Comment: Glucose reference range applies only to samples taken after fasting for at least 8 hours.   BUN 11 6 - 20 mg/dL   Creatinine, Ser 6.38 0.61 - 1.24 mg/dL   Calcium 9.0 8.9 - 75.6 mg/dL   Total Protein 7.6 6.5 - 8.1 g/dL   Albumin 4.3 3.5 - 5.0 g/dL   AST 17 15 - 41 U/L   ALT 40 0 - 44 U/L   Alkaline Phosphatase 57 38 - 126 U/L   Total Bilirubin 0.9 0.3 - 1.2 mg/dL   GFR, Estimated >43 >32 mL/min    Comment: (NOTE) Calculated using the CKD-EPI Creatinine Equation (2021)    Anion gap 11 5 - 15    Comment: Performed at [facility], [address] [place], [place] [address]  Lipase, blood     Status: None   Collection Time: 07/25/20  8:35 PM  Result Value Ref Range   Lipase 23 11 - 51 U/L    Comment: Performed at [facility], [address] [place], [place] [address]  Resp Panel by RT-PCR (Flu A&B, Covid) Nasopharyngeal Swab     Status: None   Collection Time: 07/25/20  8:35 PM   Specimen: Nasopharyngeal Swab; Nasopharyngeal(NP) swabs in vial transport medium  Result Value Ref Range   SARS Coronavirus 2 by RT PCR NEGATIVE NEGATIVE    Comment:  (NOTE) SARS-CoV-2 target nucleic acids are NOT DETECTED.  The SARS-CoV-2 RNA is generally detectable in upper respiratory specimens during the acute phase of infection. The lowest concentration of SARS-CoV-2 viral copies this assay can detect is 138 copies/mL. A negative result does not preclude SARS-Cov-2 infection and should not be used as the sole basis for treatment or other patient management decisions. A negative result may occur with  improper specimen collection/handling, submission of specimen other than nasopharyngeal swab, presence of viral mutation(s) within  the areas targeted by this assay, and inadequate number of viral copies(<138 copies/mL). A negative result must be combined with clinical observations, patient history, and epidemiological information. The expected result is Negative.  Fact Sheet for Patients:  BloggerCourse.com  Fact Sheet for Healthcare Providers:  SeriousBroker.it  This test is no t yet approved or cleared by the [place] FDA and  has been authorized for detection and/or diagnosis of SARS-CoV-2 by FDA under an Emergency Use Authorization (EUA). This EUA will remain  in effect (meaning this test can be used) for the duration of the COVID-19 declaration under Section 564(b)(1) of the Act, 21 U.S.C.section 360bbb-3(b)(1), unless the authorization is terminated  or revoked sooner.       Influenza A by PCR NEGATIVE NEGATIVE   Influenza B by PCR NEGATIVE NEGATIVE    Comment: (NOTE) The Xpert Xpress SARS-CoV-2/FLU/RSV plus assay is intended as an aid in the diagnosis of influenza from Nasopharyngeal swab specimens and should not be used as a sole basis for treatment. Nasal washings and aspirates are unacceptable for Xpert Xpress SARS-CoV-2/FLU/RSV testing.  Fact Sheet for Patients: BloggerCourse.com  Fact Sheet for Healthcare  Providers: SeriousBroker.it  This test is not yet approved or cleared by the [place] FDA and has been authorized for detection and/or diagnosis of SARS-CoV-2 by FDA under an Emergency Use Authorization (EUA). This EUA will remain in effect (meaning this test can be used) for the duration of the COVID-19 declaration under Section 564(b)(1) of the Act, 21 U.S.C. section 360bbb-3(b)(1), unless the authorization is terminated or revoked.  Performed at [facility], [address], [place], [place] [address]   Urinalysis, Routine w reflex microscopic Urine, Clean Catch     Status: Abnormal   Collection Time: 07/25/20 11:00 PM  Result Value Ref Range   Color, Urine YELLOW YELLOW   APPearance CLEAR CLEAR   Specific Gravity, Urine >1.046 (H) 1.005 - 1.030   pH 6.0 5.0 - 8.0   Glucose, UA NEGATIVE NEGATIVE mg/dL   Hgb urine dipstick NEGATIVE NEGATIVE   Bilirubin Urine NEGATIVE NEGATIVE   Ketones, ur 20 (A) NEGATIVE mg/dL   Protein, ur NEGATIVE NEGATIVE mg/dL   Nitrite NEGATIVE NEGATIVE   Leukocytes,Ua NEGATIVE NEGATIVE    Comment: Performed at [facility], [address] [place], [place] [address]   CT ABDOMEN PELVIS W CONTRAST  Result Date: 07/25/2020 CLINICAL DATA:  Right lower quadrant abdominal pain, nausea, fever EXAM: CT ABDOMEN AND PELVIS WITH CONTRAST TECHNIQUE: Multidetector CT imaging of the abdomen and pelvis was performed using the standard protocol following bolus administration of intravenous contrast. CONTRAST:  OMNIPAQUE IOHEXOL 300 MG/ML  SOLN COMPARISON:  07/24/2020, 07/25/2020 FINDINGS: Lower chest: No acute pleural or parenchymal lung disease. Hepatobiliary: Gallbladder is moderately distended. Gallstone seen on previous ultrasound are not calcified and therefore not apparent on CT. There is mild gallbladder wall thickening and pericholecystic fat stranding, which could reflect acute  cholecystitis. The liver is grossly unremarkable. Pancreas: Unremarkable. No pancreatic ductal dilatation or surrounding inflammatory changes. Spleen: Normal in size without focal abnormality. Adrenals/Urinary Tract: Adrenal glands are unremarkable. Kidneys are normal, without renal calculi, focal lesion, or hydronephrosis. Bladder is unremarkable. Stomach/Bowel: No bowel obstruction or ileus. Normal appendix right lower quadrant. No wall thickening or inflammatory change. Vascular/Lymphatic: No significant vascular findings are present. No enlarged abdominal or pelvic lymph nodes. Reproductive: Prostate is unremarkable. Other: No free fluid or free gas.  No abdominal wall hernia. Musculoskeletal: No acute or destructive  bony lesions. 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 IMPRESSION: 1. Distended gallbladder, with mild gallbladder wall thickening and pericholecystic fluid. Findings are equivocal for acute cholecystitis. If further evaluation is desired, nuclear medicine hepatobiliary scan could be considered. Electronically Signed   By: Sharlet Salina M.D.   On: 07/25/2020 22:27   US Abdomen Limited  Result Date: 07/25/2020 CLINICAL DATA:  Right upper quadrant pain for 2 days, fever, evaluate for cholecystitis EXAM: ULTRASOUND ABDOMEN LIMITED RIGHT UPPER QUADRANT COMPARISON:  Ultrasound 07/24/2020 FINDINGS: Gallbladder: Gallbladder contains multiple echogenic, shadowing gallstones measuring up to 1.7 cm in diameter. Gallbladder wall thickness is within normal limits. No pericholecystic fluid. Sonographic Eulah Pont sign is reportedly negative. Common bile duct: Diameter: 3.9 mm, nondilated Liver: Diffusely increased hepatic echogenicity with loss of definition of the portal triads and diminished posterior through transmission compatible with hepatic steatosis. No focal liver lesion. No intrahepatic biliary ductal dilatation. Portal vein is patent on color Doppler  imaging with normal direction of blood flow towards the liver. Other: None. IMPRESSION: 1. Cholelithiasis without convincing sonographic evidence of acute cholecystitis though given continued symptoms could consider further evaluation with HIDA scan as clinically warranted. 2. Hepatic steatosis. Electronically Signed   By: Kreg Shropshire M.D.   On: 07/25/2020 21:34      Assessment/Plan Hiatal hernia   Acute cholecystitis  - RUQ [place] and CT equivocal but clinically patient exam consistent with acute cholecystitis  - WBC  12>17 and patient febrile  - LFTs and Tbili normal  - to OR today for laparoscopic cholecystectomy  - admit to observation  Juliet Rude, [facility] Surgery 07/26/2020, 8:42 AM Please see Amion for pager number during day hours 7:00am-4:30pm

## 2020-07-26 NOTE — Op Note (Signed)
Garrett Baxter  Primary Care Physician:  System, Provider Not In    07/26/2020  2:16 PM  Procedure: Laparoscopic Cholecystectomy with intraoperative cholangiogram  Surgeon: Susy Frizzle B. Daphine Deutscher, MD, FACS Asst:  Trixie Deis, PA  Anes:  General  Drains:  None  Findings: Acute cholecystitis with normal IOC  Description of Procedure: The patient was taken to OR 4 and given general anesthesia.  The patient was prepped with chlorohexidine prep and draped sterilely. A time out was performed including identifying the patient and discussing their procedure.  Access to the abdomen was achieved with a 5 mm Optiview through the left upper quadrant.  Port placement included three 5 mm Optiview trocars and one 11 in the upper midline.    The gallbladder was visualized and appeared acutely inflamed and walled off.  The gallbaldder was decompressed.   The fundus of the gallbaldder was grasped and the gallbladder was elevated. Traction on the infundibulum allowed for successful demonstration of the critical view. Inflammatory changes were severe and acute.  Blunt suction dissection was used to elucidate the anatomy.  Artery was seen and double clipped.  Cystic duct was identified. .  The cystic duct was identified and clipped up on the gallbladder and an incision was made in the cystic duct and the Reddick catheter was inserted after milking the cystic duct of any debris. A dynamic cholangiogram was performed which demonstrated intrahepatic filling and flow into the duodenum..    The cystic duct was then double clipped and divided, the cystic artery was double clipped and divided and then the gallbladder was removed from the gallbladder bed. Removal of the gallbladder from the gallbladder bed was performed with one opening but no spillage.  There was arterial bleeding up near the fundus that required multiple clips at right angles to control along with electrocautery..  The gallbladder was then placed in a bag and  brought out through one of the trocar sites. The gallbladder bed was inspected and no bleeding or bile leaks were seen.   Laparoscopic visualization was used when closing fascial defects for trocar sites.   Incisions were injected with Exparel and closed with 4-0 Monocryl and Dermabond on the skin.  Sponge and needle count were correct. .    The patient was taken to the recovery room in satisfactory condition.

## 2020-07-27 ENCOUNTER — Encounter (HOSPITAL_COMMUNITY): Payer: Self-pay | Admitting: Surgery

## 2020-07-27 LAB — COMPREHENSIVE METABOLIC PANEL
ALT: 54 U/L — ABNORMAL HIGH (ref 0–44)
AST: 37 U/L (ref 15–41)
Albumin: 3.5 g/dL (ref 3.5–5.0)
Alkaline Phosphatase: 50 U/L (ref 38–126)
Anion gap: 8 (ref 5–15)
BUN: 11 mg/dL (ref 6–20)
CO2: 25 mmol/L (ref 22–32)
Calcium: 8.6 mg/dL — ABNORMAL LOW (ref 8.9–10.3)
Chloride: 104 mmol/L (ref 98–111)
Creatinine, Ser: 0.83 mg/dL (ref 0.61–1.24)
GFR, Estimated: >60 mL/min (ref >60)
Glucose, Bld: 132 mg/dL — ABNORMAL HIGH (ref 70–99)
Potassium: 5.1 mmol/L (ref 3.5–5.1)
Sodium: 137 mmol/L (ref 135–145)
Total Bilirubin: 1 mg/dL (ref 0.3–1.2)
Total Protein: 6.5 g/dL (ref 6.5–8.1)

## 2020-07-27 LAB — CBC
HCT: 39.8 % (ref 39.0–52.0)
Hemoglobin: 12.7 g/dL — ABNORMAL LOW (ref 13.0–17.0)
MCH: 31.2 pg (ref 26.0–34.0)
MCHC: 31.9 g/dL (ref 30.0–36.0)
MCV: 97.8 fL (ref 80.0–100.0)
Platelets: 327 10*3/uL (ref 150–400)
RBC: 4.07 MIL/uL — ABNORMAL LOW (ref 4.22–5.81)
RDW: 12.7 % (ref 11.5–15.5)
WBC: 25.3 10*3/uL — ABNORMAL HIGH (ref 4.0–10.5)
nRBC: 0 % (ref 0.0–0.2)

## 2020-07-27 LAB — SURGICAL PATHOLOGY

## 2020-07-27 MED ORDER — SODIUM CHLORIDE 0.9 % IV SOLN
2.0000 g | INTRAVENOUS | Status: DC
  Administered 2020-07-27: 10:00:00 2 g via INTRAVENOUS
  Filled 2020-07-27: qty 2

## 2020-07-27 MED ORDER — ACETAMINOPHEN 325 MG PO TABS
650.0000 mg | ORAL_TABLET | Freq: Four times a day (QID) | ORAL | Status: DC
  Administered 2020-07-27: 650 mg via ORAL
  Filled 2020-07-27: qty 2

## 2020-07-27 MED ORDER — ACETAMINOPHEN 500 MG PO TABS: 1000.0000 mg | ORAL_TABLET | Freq: Three times a day (TID) | ORAL | Status: DC

## 2020-07-27 MED ORDER — ACETAMINOPHEN 500 MG PO TABS: ORAL_TABLET | ORAL | 0 refills | Status: AC

## 2020-07-27 MED ORDER — OXYCODONE HCL 5 MG PO TABS: ORAL_TABLET | ORAL | 0 refills | Status: AC

## 2020-07-27 MED ORDER — IBUPROFEN 200 MG PO TABS: ORAL_TABLET | ORAL | Status: AC

## 2020-07-27 MED ORDER — IBUPROFEN 400 MG PO TABS: 600.0000 mg | ORAL_TABLET | Freq: Four times a day (QID) | ORAL | Status: DC | PRN

## 2020-07-27 NOTE — Progress Notes (Signed)
Discharged via wc to front entrance accompanied by NT and mother. Verbalized understanding of all dc instructions.

## 2020-07-27 NOTE — Progress Notes (Signed)
Progress Note  1 Day Post-Op  Subjective: Patient reports a lot of pain/soreness overnight, especially with deep breaths. Febrile overnight, but improving. Denies nausea. Passing flatus when getting up.   Objective: Vital signs in last 24 hours: Temp:  [97.9 F (36.6 C)-99.6 F (37.6 C)] 98.8 F (37.1 C) (12/02 0626) Pulse Rate:  [92-133] 110 (12/02 0818) Resp:  [17-22] 18 (12/02 0818) BP: (114-153)/(60-98) 129/81 (12/02 0626) SpO2:  [90 %-100 %] 96 % (12/02 0818) Weight:  [108.9 kg] 108.9 kg (12/01 1104) Last BM Date: 07/25/20  Intake/Output from previous day: 12/01 0701 - 12/02 0700 In: 3988.7 [P.O.:1080; I.V.:2808.7; IV Piggyback:100] Out: 2925 [Urine:2825; Blood:100] Intake/Output this shift: No intake/output data recorded.  PE: General: pleasant, WD, obese male who is laying in bed in NAD HEENT: head is normocephalic, atraumatic.  Sclera are anicteric.  PERRL.  Ears and nose without any masses or lesions.  Mouth is pink and moist Heart: regular, rate, and rhythm.   Lungs: CTAB, no wheezes, rhonchi, or rales noted.  Respiratory effort nonlabored Abd: soft, appropriately ttp, mildly distedned, BS hypoactive, incisions c/d/i MS: all 4 extremities are symmetrical with no cyanosis, clubbing, or edema. Skin: warm and dry with no masses, lesions, or rashes Neuro: Cranial nerves 2-12 grossly intact, sensation is normal throughout Psych: A&Ox3 with an appropriate affect.    Lab Results:  Recent Labs    07/25/20 2035 07/27/20 0543  WBC 17.4* 25.3*  HGB 14.4 12.7*  HCT 43.6 39.8  PLT 363 327   BMET Recent Labs    07/25/20 2035 07/27/20 0543  NA 138 137  K 3.7 5.1  CL 100 104  CO2 27 25  GLUCOSE 108* 132*  BUN 11 11  CREATININE 0.76 0.83  CALCIUM 9.0 8.6*   PT/INR No results for input(s): LABPROT, INR in the last 72 hours. CMP     Component Value Date/Time   NA 137 07/27/2020 0543   K 5.1 07/27/2020 0543   CL 104 07/27/2020 0543   CO2 25 07/27/2020  0543   GLUCOSE 132 (H) 07/27/2020 0543   BUN 11 07/27/2020 0543   CREATININE 0.83 07/27/2020 0543   CALCIUM 8.6 (L) 07/27/2020 0543   PROT 6.5 07/27/2020 0543   ALBUMIN 3.5 07/27/2020 0543   AST 37 07/27/2020 0543   ALT 54 (H) 07/27/2020 0543   ALKPHOS 50 07/27/2020 0543   BILITOT 1.0 07/27/2020 0543   GFRNONAA >60 07/27/2020 0543   Lipase     Component Value Date/Time   LIPASE 23 07/25/2020 2035       Studies/Results: DG Cholangiogram Operative  Result Date: 07/26/2020 CLINICAL DATA:  24 year old male with a history of cholelithiasis EXAM: INTRAOPERATIVE CHOLANGIOGRAM TECHNIQUE: Cholangiographic images from the C-arm fluoroscopic device were submitted for interpretation post-operatively. Please see the procedural report for the amount of contrast and the fluoroscopy time utilized. COMPARISON:  CT 07/25/2020 FINDINGS: Surgical instruments project over the upper abdomen. There is cannulation of the cystic duct/gallbladder neck, with antegrade infusion of contrast. Caliber of the extrahepatic ductal system within normal limits. Contrast crosses the ampulla into the duodenum, however, there is a questionable meniscus in the distal common bile duct at the ampulla, without a confident contrast column at this location. . IMPRESSION: Intraoperative cholangiogram demonstrates extrahepatic biliary ducts of unremarkable caliber, with absent of a solid contrast column at the distal common bile duct with potential meniscus, which may represent retained stones/debris. Correlation with ERCP or MRCP may be indicated. Please refer to the dictated operative  report for full details of intraoperative findings and procedure Electronically Signed   By: Gilmer Mor D.O.   On: 07/26/2020 13:56   CT ABDOMEN PELVIS W CONTRAST  Result Date: 07/25/2020 CLINICAL DATA:  Right lower quadrant abdominal pain, nausea, fever EXAM: CT ABDOMEN AND PELVIS WITH CONTRAST TECHNIQUE: Multidetector CT imaging of the abdomen  and pelvis was performed using the standard protocol following bolus administration of intravenous contrast. CONTRAST:  OMNIPAQUE IOHEXOL 300 MG/ML  SOLN COMPARISON:  07/24/2020, 07/25/2020 FINDINGS: Lower chest: No acute pleural or parenchymal lung disease. Hepatobiliary: Gallbladder is moderately distended. Gallstone seen on previous ultrasound are not calcified and therefore not apparent on CT. There is mild gallbladder wall thickening and pericholecystic fat stranding, which could reflect acute cholecystitis. The liver is grossly unremarkable. Pancreas: Unremarkable. No pancreatic ductal dilatation or surrounding inflammatory changes. Spleen: Normal in size without focal abnormality. Adrenals/Urinary Tract: Adrenal glands are unremarkable. Kidneys are normal, without renal calculi, focal lesion, or hydronephrosis. Bladder is unremarkable. Stomach/Bowel: No bowel obstruction or ileus. Normal appendix right lower quadrant. No wall thickening or inflammatory change. Vascular/Lymphatic: No significant vascular findings are present. No enlarged abdominal or pelvic lymph nodes. Reproductive: Prostate is unremarkable. Other: No free fluid or free gas.  No abdominal wall hernia. Musculoskeletal: No acute or destructive bony lesions. 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 IMPRESSION: 1. Distended gallbladder, with mild gallbladder wall thickening and pericholecystic fluid. Findings are equivocal for acute cholecystitis. If further evaluation is desired, nuclear medicine hepatobiliary scan could be considered. Electronically Signed   By: Sharlet Salina M.D.   On: 07/25/2020 22:27   US Abdomen Limited  Result Date: 07/25/2020 CLINICAL DATA:  Right upper quadrant pain for 2 days, fever, evaluate for cholecystitis EXAM: ULTRASOUND ABDOMEN LIMITED RIGHT UPPER QUADRANT COMPARISON:  Ultrasound 07/24/2020 FINDINGS: Gallbladder: Gallbladder contains multiple echogenic,  shadowing gallstones measuring up to 1.7 cm in diameter. Gallbladder wall thickness is within normal limits. No pericholecystic fluid. Sonographic Eulah Pont sign is reportedly negative. Common bile duct: Diameter: 3.9 mm, nondilated Liver: Diffusely increased hepatic echogenicity with loss of definition of the portal triads and diminished posterior through transmission compatible with hepatic steatosis. No focal liver lesion. No intrahepatic biliary ductal dilatation. Portal vein is patent on color Doppler imaging with normal direction of blood flow towards the liver. Other: None. IMPRESSION: 1. Cholelithiasis without convincing sonographic evidence of acute cholecystitis though given continued symptoms could consider further evaluation with HIDA scan as clinically warranted. 2. Hepatic steatosis. Electronically Signed   By: Kreg Shropshire M.D.   On: 07/25/2020 21:34    Anti-infectives: Anti-infectives (From admission, onward)   Start     Dose/Rate Route Frequency Ordered Stop   07/26/20 0930  cefTRIAXone (ROCEPHIN) 2 g in sodium chloride 0.9 % 100 mL IVPB        2 g 200 mL/hr over 30 Minutes Intravenous  Once 07/26/20 0844 07/26/20 0948   07/25/20 2315  cefTRIAXone (ROCEPHIN) 2 g in sodium chloride 0.9 % 100 mL IVPB        2 g 200 mL/hr over 30 Minutes Intravenous  Once 07/25/20 2300 07/26/20 0045       Assessment/Plan Hiatal hernia   Acute cholecystitis  S/P laparoscopic cholecystectomy 07/26/20 Dr. Daphine Deutscher  - POD#1 - hgb 12.7 from 14.4 pre-op, will continue to monitor - needs to mobilize this AM - work on transition to PO pain control, scheduled tylenol - may be ready for discharge later today v. Tomorrow AM pending progress  FEN: reg, IVF VTE: SCDs, lovenox ID: Rocephin 11/30>>  LOS: 0 days    Juliet Rude , [facility] Surgery 07/27/2020, 9:19 AM Please see Amion for pager number during day hours 7:00am-4:30pm

## 2020-08-07 NOTE — Discharge Summary (Signed)
Physician Discharge Summary  Patient ID: Garrett Baxter MRN: [id number] DOB/AGE: 11-07-1995 24 y.o.  Admit date: 07/26/2020 Discharge date: 07/27/2020  Admission Diagnoses:  Acute cholecystitis  Discharge Diagnoses:  Same   Active Problems:   Acute cholecystitis due to biliary calculus   PROCEDURES: laparoscopic cholecystectomy, 12/1/212, Dr. Sandie Ano Course:  Patient is a 24 year old male who presented to [facility] with abdominal pain and fever. Pain started in RUQ 2 days ago and described as intermittent and sharp. Pain radiates to right flank and chest. Febrile to 101 at home. Some associated nausea but no vomiting. Denies SOB, urinary symptoms, diarrhea, constipation. PMH otherwise significant for hiatal hernia. No past abdominal surgery. He takes dexilant as needed for symptoms related to hiatal hernia. Allergic to shellfish. He works as an Biomedical scientist. Reports occasional alcohol and tobacco use but not daily. Denies illicit drug use.   Pt was seen in the ER, and admitted.  She was taken to the OR by Dr. Daphine Deutscher. She did well and was discharged on the first post op day.  CBC Latest Ref Rng & Units 07/27/2020 07/25/2020 07/24/2020  WBC 4.0 - 10.5 K/uL 25.3(H) 17.4(H) 12.2(H)  Hemoglobin 13.0 - 17.0 g/dL 12.7(L) 14.4 14.6  Hematocrit 39.0 - 52.0 % 39.8 43.6 43.0  Platelets 150 - 400 K/uL 327 363 355   CMP Latest Ref Rng & Units 07/27/2020 07/25/2020 07/24/2020  Glucose 70 - 99 mg/dL 409(W) 119(J) 478(G)  BUN 6 - 20 mg/dL 11 11 13   Creatinine 0.61 - 1.24 mg/dL 9.56 2.13  Sodium 135 - 145 mmol/L 137 138 142  Potassium 3.5 - 5.1 mmol/L 5.1 3.7 4.1  Chloride 98 - 111 mmol/L 104 100 100  CO2 22 - 32 mmol/L 25 27 31   Calcium 8.9 - 10.3 mg/dL 0.86) 9.0 9.6  Total Protein 6.5 - 8.1 g/dL 6.5 7.6 7.7  Total Bilirubin 0.3 - 1.2 mg/dL 1.0 0.9 0.5  Alkaline Phos 38 - 126 U/L 50 57 57  AST 15 - 41 U/L 37 17 27  ALT 0 - 44 U/L 54(H) 40 54(H)   SURGICAL PATHOLOGY  SURGICAL PATHOLOGY  CASE: [id number]  PATIENT: Fabion Curd  Surgical Pathology Report  Clinical History: cholelithiasis (kp)   FINAL MICROSCOPIC DIAGNOSIS:   A. GALLBLADDER, CHOLECYSTECTOMY:  - Acute cholecystitis with necrosis.  - Cholelithiasis.     Condition on DC:  Improved  Disposition: Discharge disposition: 01-Home or Self Care        Allergies as of 07/27/2020      Reactions   Shellfish Allergy Swelling      Medication List    STOP taking these medications   HYDROcodone-acetaminophen 5-325 MG tablet Commonly known as: NORCO/VICODIN   ondansetron 4 MG disintegrating tablet Commonly known as: Zofran ODT     TAKE these medications   acetaminophen 500 MG tablet Commonly known as: TYLENOL You can take 1000 mg of Tylenol every 6 hours as needed for pain.  You can alternate with ibuprofen.  Do not take more than 4000 mg of Tylenol per day can harm your liver.  You can buy both Tylenol/acetaminophen and ibuprofen over-the-counter at any drugstore What changed:   how much to take  how to take this  when to take this  reasons to take this  additional instructions   Dexilant 30 MG capsule Generic drug: Dexlansoprazole Take 30 mg by mouth daily as needed (acid reflux).   ibuprofen 200 MG tablet Commonly known as: ADVIL You  can take 2 to 3 tablets every 6 hours as needed for pain not relieved by plain Tylenol.  You can alternate Tylenol/acetaminophen and ibuprofen.  You can buy both of these medications over-the-counter at any drugstore. What changed:   how much to take  how to take this  when to take this  reasons to take this  additional instructions   oxyCODONE 5 MG immediate release tablet Commonly known as: Oxy IR/ROXICODONE You can take 1 tablet every 6 hours as needed for pain not relieved with Tylenol/acetaminophen, and ibuprofen       Follow-up Information    Surgery, [facility]. Go on 08/15/2020.   Specialty: General  Surgery Why: Follow up appointment scheduled for 9:45 AM. Please arrive 30 min prior to appointment time. Bring photo ID and insurance information.  Contact information: [address] [place] [address] [phone]               Signed: Sherrie George 08/07/2020, 5:07 PM

## 2021-03-06 IMAGING — CT CT ABD-PELV W/ CM
2 of 4 series · 15 of 46 positions shown, 17 images · IV contrast (omnipaque)
Comparison: 07/24/2020, 07/25/2020

CLINICAL DATA: Right lower quadrant abdominal pain, nausea, fever

EXAM:
CT ABDOMEN AND PELVIS WITH CONTRAST
TECHNIQUE: Multidetector CT imaging of the abdomen and pelvis was performed
using the standard protocol following bolus administration of
intravenous contrast.
CONTRAST:  100mL OMNIPAQUE IOHEXOL 300 MG/ML  SOLN

[Series 2: axial st · axial · 0.80mm/px · z∈[+1112,+1582]mm · 12 of 106 slices shown, 14 images]
[im 6/106  soft-tissue]
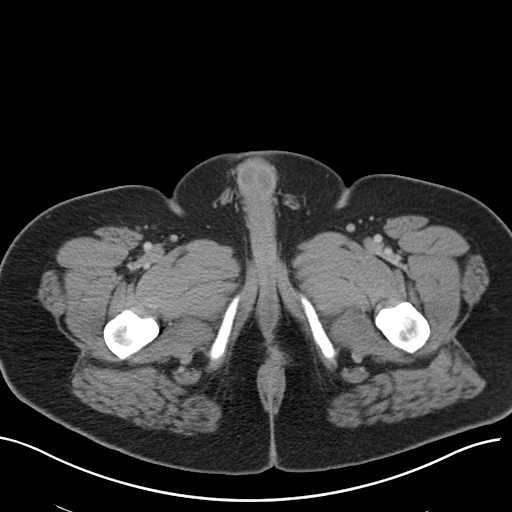
[im 6/106  bone]
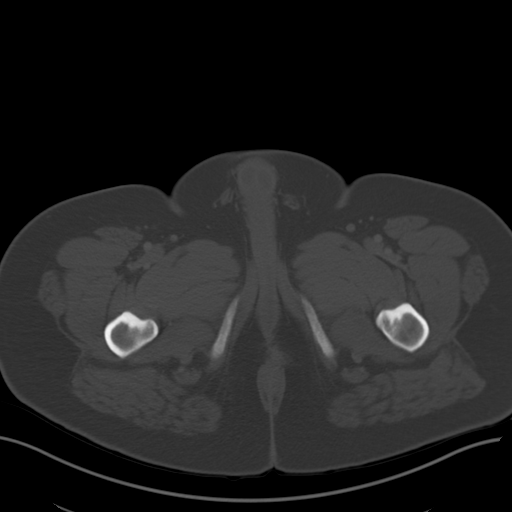
[im 18/106  soft-tissue]
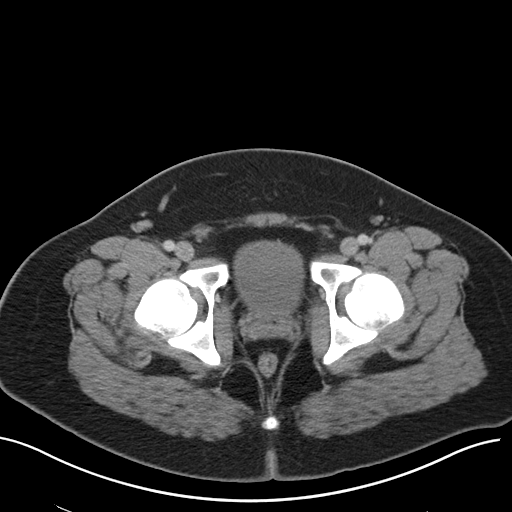
[im 24/106  soft-tissue]
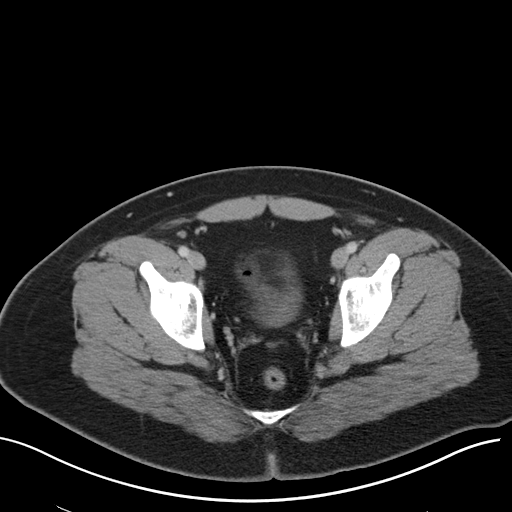
[im 30/106  soft-tissue]
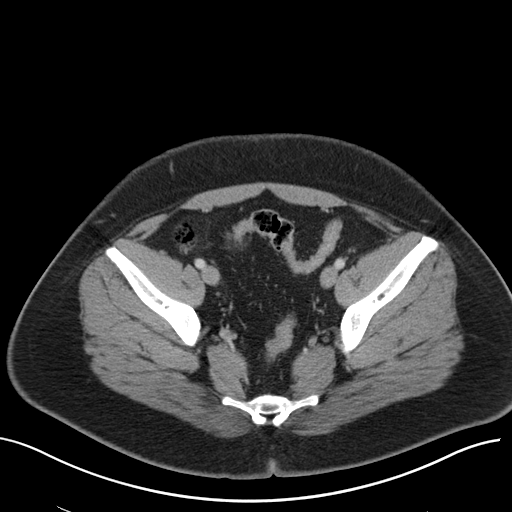
[im 41/106  soft-tissue]
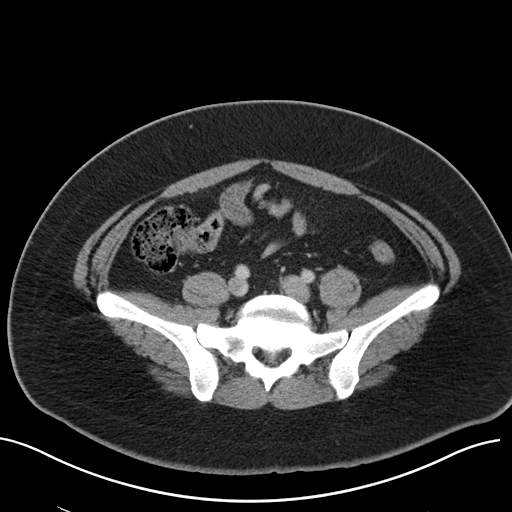
[im 47/106  soft-tissue]
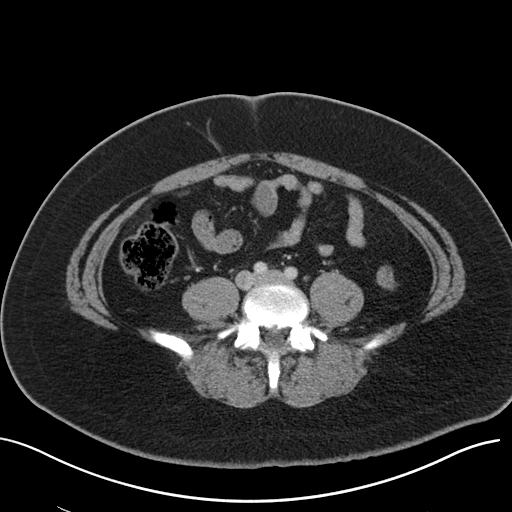
[im 59/106  soft-tissue]
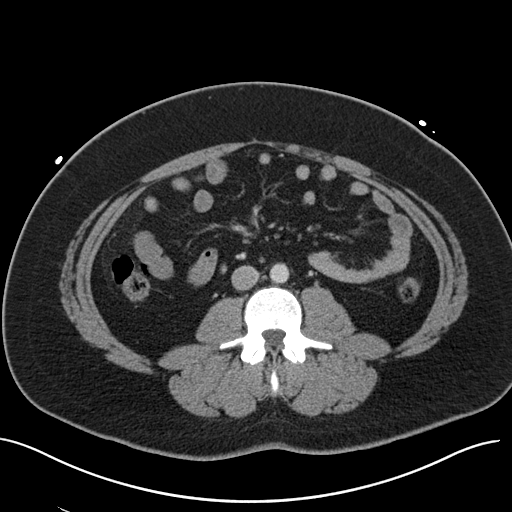
[im 65/106  soft-tissue]
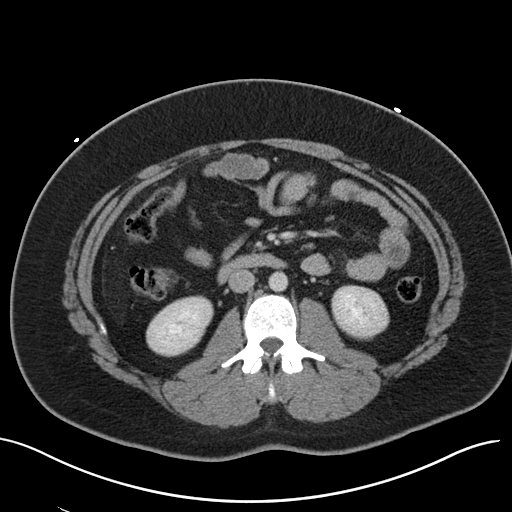
[im 76/106  soft-tissue]
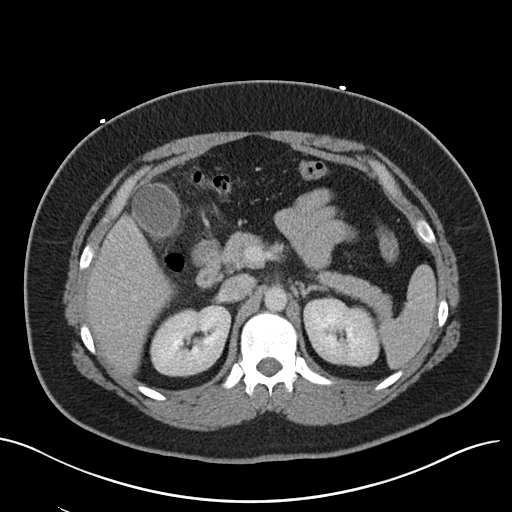
[im 76/106  bone]
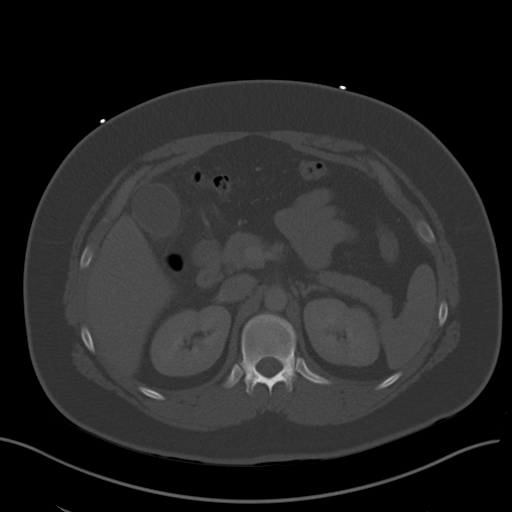
[im 82/106  soft-tissue]
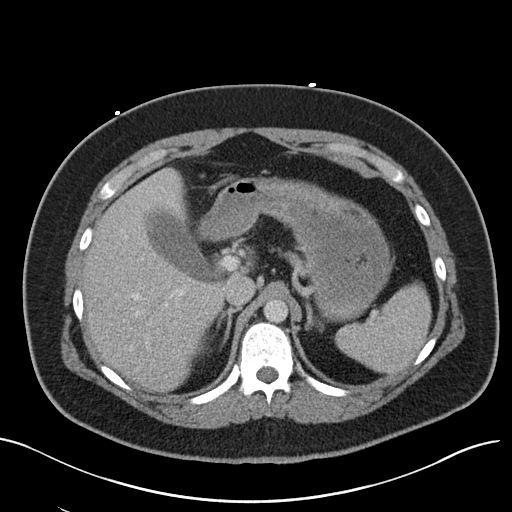
[im 88/106  soft-tissue]
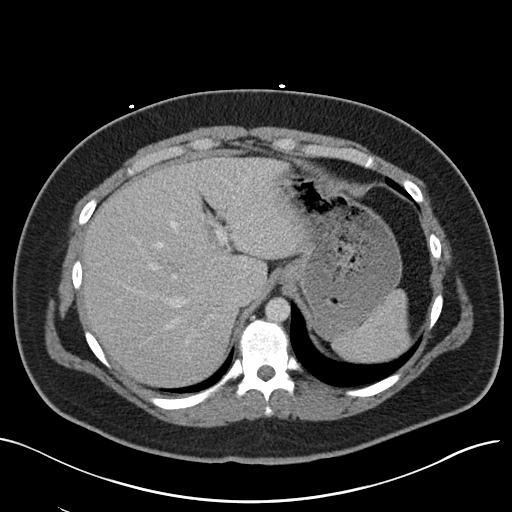
[im 100/106  soft-tissue]
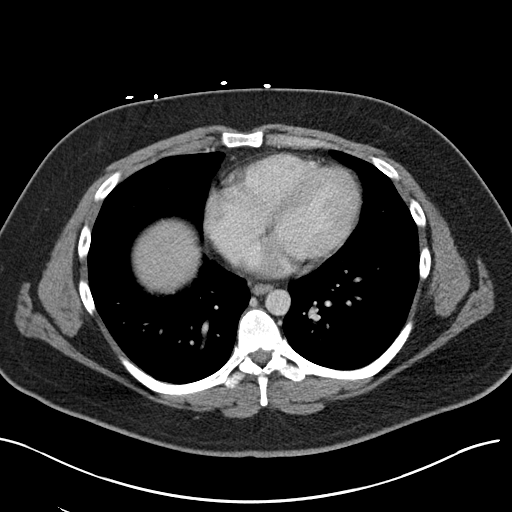

[Series 5: coronal st · coronal · 0.73mm/px · 3 of 160 slices shown]
[im 54/160  soft-tissue]
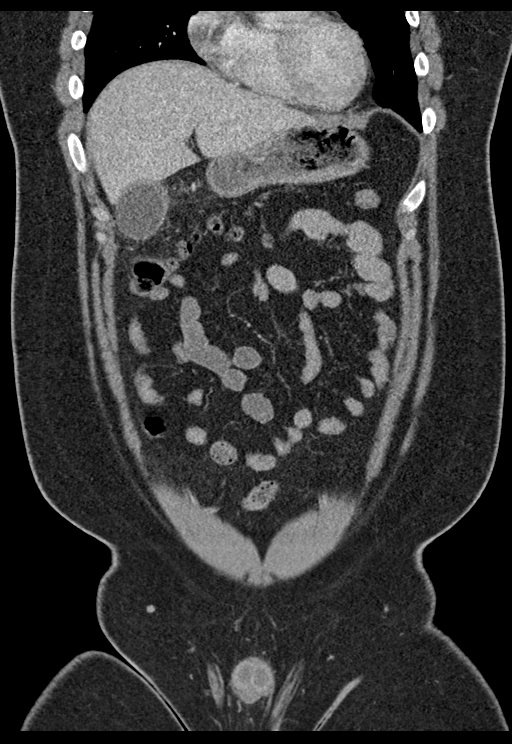
[im 71/160  soft-tissue]
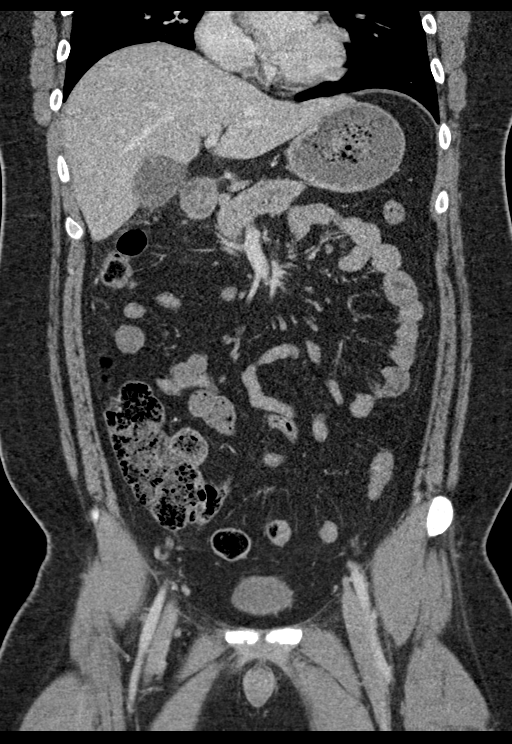
[im 89/160  soft-tissue]
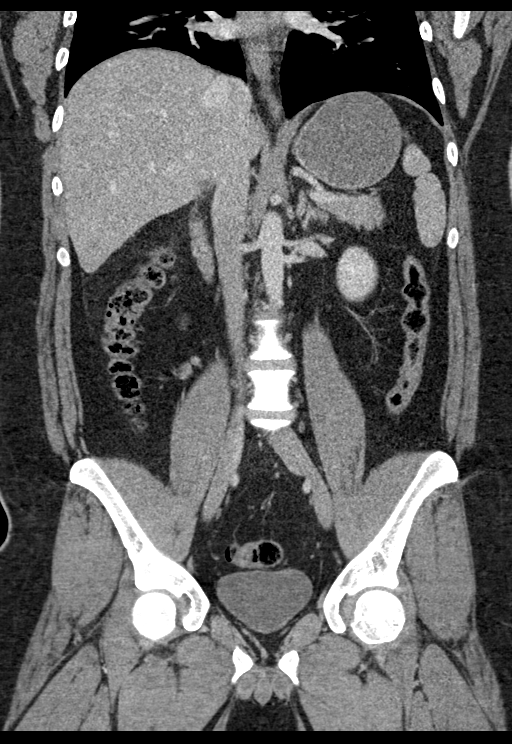

[15 of 46 positions shown; findings below may reference images not displayed]

FINDINGS: Lower chest: No acute pleural or parenchymal lung disease.

Hepatobiliary: Gallbladder is moderately distended. Gallstone seen
on previous ultrasound are not calcified and therefore not apparent
on CT. There is mild gallbladder wall thickening and pericholecystic
fat stranding, which could reflect acute cholecystitis.

The liver is grossly unremarkable.

Pancreas: Unremarkable. No pancreatic ductal dilatation or
surrounding inflammatory changes.

Spleen: Normal in size without focal abnormality.

Adrenals/Urinary Tract: Adrenal glands are unremarkable. Kidneys are
normal, without renal calculi, focal lesion, or hydronephrosis.
Bladder is unremarkable.

Stomach/Bowel: No bowel obstruction or ileus. Normal appendix right
lower quadrant. No wall thickening or inflammatory change.

Vascular/Lymphatic: No significant vascular findings are present. No
enlarged abdominal or pelvic lymph nodes.

Reproductive: Prostate is unremarkable.

Other: No free fluid or free gas.  No abdominal wall hernia.

Musculoskeletal: No acute or destructive bony lesions. Reconstructed
images demonstrate bilateral L5 spondylolysis without
spondylolisthesis.
IMPRESSION: 1. Distended gallbladder, with mild gallbladder wall thickening and
pericholecystic fluid. Findings are equivocal for acute
cholecystitis. If further evaluation is desired, nuclear medicine
hepatobiliary scan could be considered.
